# Patient Record
Sex: Male | Born: 1975 | Race: Black or African American | Hispanic: No | Marital: Single | Smoking: Never smoker
Health system: Southern US, Community
[De-identification: ages and names within clinical notes are randomized; demographics above are authoritative.]

## PROBLEM LIST (undated history)

## (undated) DIAGNOSIS — I10 Essential (primary) hypertension: Secondary | ICD-10-CM

## (undated) DIAGNOSIS — E119 Type 2 diabetes mellitus without complications: Secondary | ICD-10-CM

## (undated) HISTORY — DX: Type 2 diabetes mellitus without complications: E11.9

## (undated) HISTORY — PX: FINGER EXPLORATION: SHX1635

---

## 2009-06-27 ENCOUNTER — Emergency Department (HOSPITAL_COMMUNITY): Admission: EM | Admit: 2009-06-27 | Discharge: 2009-06-27 | Payer: Self-pay | Admitting: Family Medicine

## 2013-09-24 ENCOUNTER — Encounter (HOSPITAL_COMMUNITY): Payer: Self-pay | Admitting: Emergency Medicine

## 2013-09-24 DIAGNOSIS — I10 Essential (primary) hypertension: Secondary | ICD-10-CM | POA: Insufficient documentation

## 2013-09-24 DIAGNOSIS — K130 Diseases of lips: Secondary | ICD-10-CM | POA: Insufficient documentation

## 2013-09-24 NOTE — ED Notes (Signed)
Pt. reports progressing right lower lip abscess onset last week with drainage .

## 2013-09-25 ENCOUNTER — Emergency Department (HOSPITAL_COMMUNITY)
Admission: EM | Admit: 2013-09-25 | Discharge: 2013-09-25 | Disposition: A | Discharge status: Home / Self Care | Payer: Self-pay | Attending: Emergency Medicine | Admitting: Emergency Medicine

## 2013-09-25 DIAGNOSIS — K130 Diseases of lips: Secondary | ICD-10-CM

## 2013-09-25 HISTORY — DX: Essential (primary) hypertension: I10

## 2013-09-25 MED ORDER — SULFAMETHOXAZOLE-TRIMETHOPRIM 800-160 MG PO TABS: 1.0000 | ORAL_TABLET | Freq: Two times a day (BID) | ORAL | Status: DC

## 2013-09-25 MED ORDER — TRAMADOL HCL 50 MG PO TABS: 50.0000 mg | ORAL_TABLET | Freq: Four times a day (QID) | ORAL | Status: DC | PRN

## 2013-09-25 NOTE — ED Notes (Signed)
R lower lip abscess since last Thursday. Lip swollen and red with a visible 'head.' Pt states the pain to lip is making his blood pressures high.

## 2013-09-25 NOTE — Discharge Instructions (Signed)
Take the prescribed medication as directed. Continue warm compresses at home to help aid drainage. Return to the ED for increased redness, swelling, drainage, etc.

## 2013-09-25 NOTE — ED Provider Notes (Signed)
Medical screening examination/treatment/procedure(s) were performed by non-physician practitioner and as supervising physician I was immediately available for consultation/collaboration.   EKG Interpretation None        Gwyneth SproutWhitney Mikhaila Roh, MD 09/25/13 [phone]

## 2013-09-25 NOTE — ED Provider Notes (Signed)
CSN: [id number]     Arrival date & time 09/24/13  2242 History   First MD Initiated Contact with Patient 09/25/13 0033     Chief Complaint  Patient presents with  . Oral Swelling     (Consider location/radiation/quality/duration/timing/severity/associated sxs/prior Treatment) The history is provided by the patient and medical records.   This is a 38 year old male with past medical history significant for hypertension, presenting to the ED for right lower lip abscess. Patient states abscess started to approximately one week ago, has been progressively increasing in size. States yesterday it developed a "head", he popped it with some purulent drainage expelled. States since then it has scabbed over and stop draining. He now has increased swelling of his right lower lip. Denies any difficulty swallowing or breathing. Denies any fevers or chills. Denies any dental pain.  Past Medical History  Diagnosis Date  . Hypertension    History reviewed. No pertinent past surgical history. No family history on file. History  Substance Use Topics  . Smoking status: Never Smoker   . Smokeless tobacco: Not on file  . Alcohol Use: Yes    Review of Systems  Skin:       Abscess, lip swelling  All other systems reviewed and are negative.   Allergies  Review of patient's allergies indicates no known allergies.  Home Medications  No current outpatient prescriptions on file. BP 184/98  Pulse 98  Temp(Src) 99.3 F (37.4 C) (Oral)  Resp 14  Ht 6' (1.829 m)  Wt 239 lb (108.41 kg)  BMI 32.41 kg/m2  SpO2 100%  Physical Exam  Nursing note and vitals reviewed. Constitutional: He is oriented to person, place, and time. He appears well-developed and well-nourished. No distress.  HENT:  Head: Normocephalic and atraumatic.  Mouth/Throat: Uvula is midline, oropharynx is clear and moist and mucous membranes are normal. No oropharyngeal exudate, posterior oropharyngeal edema, posterior oropharyngeal  erythema or tonsillar abscesses.    Small non-draining abscess of right lower lip with scab present; large amount of swelling without extension into chin or neck; dentition intact without evidence of dental abscess; gingiva normal in appearance; airway patent; handling secretions appropriately; no difficulty swallowing or speaking  Eyes: Conjunctivae and EOM are normal. Pupils are equal, round, and reactive to light.  Neck: Normal range of motion. Neck supple.  Cardiovascular: Normal rate, regular rhythm and normal heart sounds.   Pulmonary/Chest: Effort normal and breath sounds normal. No respiratory distress. He has no wheezes.  Abdominal: Soft. Bowel sounds are normal. There is no tenderness. There is no guarding.  Musculoskeletal: Normal range of motion.  Neurological: He is alert and oriented to person, place, and time.  Skin: Skin is warm and dry. He is not diaphoretic.  Psychiatric: He has a normal mood and affect.    ED Course  Procedures (including critical care time)  INCISION AND DRAINAGE Performed by: Garlon HatchetSANDERS, Maylea Soria M Consent: Verbal consent obtained. Risks and benefits: risks, benefits and alternatives were discussed Type: abscess  Body area: right lower lip  Anesthesia: local infiltration  Incision was made with a scalpel.  Local anesthetic: lidocaine 1% without epinephrine  Anesthetic total: 4 ml  Complexity: complex Blunt dissection to break up loculations  Drainage: purulent  Drainage amount: small  Packing material: none  Patient tolerance: Patient tolerated the procedure well with no immediate complications.    Labs Review Labs Reviewed - No data to display Imaging Review No results found.   EKG Interpretation None  MDM   Final diagnoses:  Abscess, lip   I&D performed with small amount of purulent drainage expelled.  Given location and severity of swelling, will start on abx, advised to do warm compresses at home to help aid  drainage.  Pt will FU with wellness clinic.  Monitor for increased redness, swelling, fevers, chills-- if these occur, return to the ED for re-check.  Discussed plan with pt, he acknowledged understanding and agreed with plan of care.  Return precautions advised.  Garlon Hatchet, PA-C 09/25/13 [phone]

## 2014-11-10 ENCOUNTER — Emergency Department (HOSPITAL_COMMUNITY)
Admission: EM | Admit: 2014-11-10 | Discharge: 2014-11-10 | Disposition: A | Discharge status: Home / Self Care | Payer: Self-pay | Attending: Emergency Medicine | Admitting: Emergency Medicine

## 2014-11-10 ENCOUNTER — Emergency Department (HOSPITAL_COMMUNITY): Payer: Self-pay

## 2014-11-10 ENCOUNTER — Encounter (HOSPITAL_COMMUNITY): Payer: Self-pay | Admitting: *Deleted

## 2014-11-10 ENCOUNTER — Other Ambulatory Visit: Payer: Self-pay

## 2014-11-10 DIAGNOSIS — R0789 Other chest pain: Secondary | ICD-10-CM

## 2014-11-10 DIAGNOSIS — I10 Essential (primary) hypertension: Secondary | ICD-10-CM | POA: Insufficient documentation

## 2014-11-10 DIAGNOSIS — I159 Secondary hypertension, unspecified: Secondary | ICD-10-CM | POA: Insufficient documentation

## 2014-11-10 LAB — CBC
HCT: 42 % (ref 39.0–52.0)
Hemoglobin: 15.1 g/dL (ref 13.0–17.0)
MCH: 28.8 pg (ref 26.0–34.0)
MCHC: 36 g/dL (ref 30.0–36.0)
MCV: 80 fL (ref 78.0–100.0)
Platelets: 248 10*3/uL (ref 150–400)
RBC: 5.25 MIL/uL (ref 4.22–5.81)
RDW: 12.7 % (ref 11.5–15.5)
WBC: 4.6 10*3/uL (ref 4.0–10.5)

## 2014-11-10 LAB — BASIC METABOLIC PANEL
Anion gap: 10 (ref 5–15)
BUN: 6 mg/dL (ref 6–20)
CALCIUM: 8.9 mg/dL (ref 8.9–10.3)
CO2: 25 mmol/L (ref 22–32)
CREATININE: 1.06 mg/dL (ref 0.61–1.24)
Chloride: 102 mmol/L (ref 101–111)
GFR calc Af Amer: >60 mL/min (ref >60)
GLUCOSE: 347 mg/dL — AB (ref 70–99)
Potassium: 3.5 mmol/L (ref 3.5–5.1)
Sodium: 137 mmol/L (ref 135–145)

## 2014-11-10 LAB — I-STAT TROPONIN, ED: Troponin i, poc: 0.01 ng/mL (ref 0.00–0.08)

## 2014-11-10 MED ORDER — IBUPROFEN 600 MG PO TABS: 600.0000 mg | ORAL_TABLET | Freq: Four times a day (QID) | ORAL | Status: DC | PRN

## 2014-11-10 MED ORDER — IBUPROFEN 400 MG PO TABS
800.0000 mg | ORAL_TABLET | Freq: Once | ORAL | Status: AC
  Administered 2014-11-10: 800 mg via ORAL
  Filled 2014-11-10: qty 2

## 2014-11-10 MED ORDER — AMLODIPINE BESYLATE 10 MG PO TABS: 10.0000 mg | ORAL_TABLET | Freq: Every day | ORAL | Status: DC

## 2014-11-10 NOTE — ED Notes (Signed)
Pt sts he was having some tingling in his left arm which "felt like my arm was numb".  Sts it has been getting better through out the day.

## 2014-11-10 NOTE — ED Provider Notes (Signed)
CSN: [id number]     Arrival date & time 11/10/14  1550 History   First MD Initiated Contact with Patient 11/10/14 2014     Chief Complaint  Patient presents with  . Chest Pain     (Consider location/radiation/quality/duration/timing/severity/associated sxs/prior Treatment) HPI Comments: Patient is a 39 yo M PMHx significant for HTN presenting to the ED for evaluation of constant chest pressure with radiation to his left arm. He describes his left arm as "tingling." He states exercise makes his chest pain better. He states had this pain on and off for the last several weeks it became more constant over the last 3 days. He states since he has been in the emergency department his pain has gotten better, rates at a 1 out of 10 currently. Denies any early familial cardiac history. No history of echocardiogram, cardiac catheterization, stress test. PERC negative.   Patient is a 39 y.o. male presenting with chest pain.  Chest Pain   Past Medical History  Diagnosis Date  . Hypertension    History reviewed. No pertinent past surgical history. No family history on file. History  Substance Use Topics  . Smoking status: Never Smoker   . Smokeless tobacco: Not on file  . Alcohol Use: Yes    Review of Systems  Cardiovascular: Positive for chest pain.  All other systems reviewed and are negative.     Allergies  Review of patient's allergies indicates no known allergies.  Home Medications   Prior to Admission medications   Medication Sig Start Date End Date Taking? Authorizing Provider  amLODipine (NORVASC) 10 MG tablet Take 1 tablet (10 mg total) by mouth daily. 11/10/14   Majesta Leichter, PA-C  ibuprofen (ADVIL,MOTRIN) 600 MG tablet Take 1 tablet (600 mg total) by mouth every 6 (six) hours as needed. 11/10/14   Tyffani Foglesong, PA-C  sulfamethoxazole-trimethoprim (SEPTRA DS) 800-160 MG per tablet Take 1 tablet by mouth every 12 (twelve) hours. Patient not taking: Reported on  11/10/2014 09/25/13   Garlon HatchetLisa M Sanders, PA-C  traMADol (ULTRAM) 50 MG tablet Take 1 tablet (50 mg total) by mouth every 6 (six) hours as needed. Patient not taking: Reported on 11/10/2014 09/25/13   Garlon HatchetLisa M Sanders, PA-C   BP 139/101 mmHg  Pulse 78  Temp(Src) 98.6 F (37 C) (Oral)  Resp 18  SpO2 100% Physical Exam  Constitutional: He is oriented to person, place, and time. He appears well-developed and well-nourished. No distress.  HENT:  Head: Normocephalic and atraumatic.  Right Ear: External ear normal.  Left Ear: External ear normal.  Nose: Nose normal.  Mouth/Throat: No oropharyngeal exudate.  Eyes: Conjunctivae are normal.  Neck: Neck supple.  Cardiovascular: Normal rate, regular rhythm and normal heart sounds.   Pulmonary/Chest: Effort normal and breath sounds normal. He exhibits no tenderness.  Abdominal: Soft. There is no tenderness.  Musculoskeletal: Normal range of motion. He exhibits no edema.  Neurological: He is alert and oriented to person, place, and time.  Sensation grossly intact.   Skin: Skin is warm and dry. He is not diaphoretic.  Nursing note and vitals reviewed.   ED Course  Procedures (including critical care time) Medications  ibuprofen (ADVIL,MOTRIN) tablet 800 mg (800 mg Oral Given 11/10/14 2113)    Labs Review Labs Reviewed  BASIC METABOLIC PANEL - Abnormal; Notable for the following:    Glucose, Bld 347 (*)    All other components within normal limits  CBC  I-STAT TROPOININ, ED    Imaging Review Dg Chest  2 View  11/10/2014   CLINICAL DATA:  One day history of chest pain  EXAM: CHEST  2 VIEW  COMPARISON:  None.  FINDINGS: Lungs are clear. Heart size and pulmonary vascularity are normal. No adenopathy. No pneumothorax. No bone lesions.  IMPRESSION: No abnormality noted.   Electronically Signed   By: Bretta BangWilliam  Woodruff III M.D.   On: 11/10/2014 17:17     EKG Interpretation   Date/Time:  Wednesday Nov 10 2014 15:55:58 EDT Ventricular Rate:  99 PR  Interval:  166 QRS Duration: 86 QT Interval:  316 QTC Calculation: 405 R Axis:   63 Text Interpretation:  Sinus rhythm with marked sinus arrhythmia T wave  abnormality, consider inferolateral ischemia Abnormal ECG Sinus rhythm T  wave abnormality Abnormal ekg Confirmed by Gerhard MunchLOCKWOOD, ROBERT  MD [phone]([id number]) on  11/10/2014 8:58:53 PM      MDM   Final diagnoses:  Chest pain, atypical  Secondary hypertension, unspecified    Filed Vitals:   11/10/14 2116  BP: 139/101  Pulse: 78  Temp: 98.6 F (37 C)  Resp: 18   Afebrile, NAD, non-toxic appearing, AAOx4.   1) CP: Patient is to be discharged with recommendation to follow up with PCP in regards to today's hospital visit. Chest pain is not likely of cardiac or pulmonary etiology d/t presentation, perc negative, VSS, no tracheal deviation, no JVD or new murmur, RRR, breath sounds equal bilaterally, EKG reviewed, negative troponin, and negative CXR. Heart score 2.   2) HTN: Patient noted to be hypertensive in the emergency department.  No signs of hypertensive urgency.  Discussed with patient the need for close follow-up and management by their primary care physician.   Pt has been advised start a PPI and return to the ED is CP becomes exertional, associated with diaphoresis or nausea, radiates to left jaw/arm, worsens or becomes concerning in any way. Pt appears reliable for follow up and is agreeable to discharge. Patient d/w with Dr. Jeraldine LootsLockwood, agrees with plan.     Francee PiccoloJennifer Deziyah Arvin, PA-C 11/11/14 0043  Gerhard Munchobert Lockwood, MD 11/11/14 2056

## 2014-11-10 NOTE — ED Notes (Signed)
Pt states that he began having progressive left chest pain that radiates under arm. Pt states that pain was intermittent initially and is now constant. Denies SOB

## 2014-11-10 NOTE — Discharge Instructions (Signed)
Please follow up with your primary care physician in 1-2 days. If you do not have one please call the [facility] number listed above. Please read all discharge instructions and return precautions.    Chest Pain (Nonspecific) It is often hard to give a specific diagnosis for the cause of chest pain. There is always a chance that your pain could be related to something serious, such as a heart attack or a blood clot in the lungs. You need to follow up with your health care provider for further evaluation. CAUSES   Heartburn.  Pneumonia or bronchitis.  Anxiety or stress.  Inflammation around your heart (pericarditis) or lung (pleuritis or pleurisy).  A blood clot in the lung.  A collapsed lung (pneumothorax). It can develop suddenly on its own (spontaneous pneumothorax) or from trauma to the chest.  Shingles infection (herpes zoster virus). The chest wall is composed of bones, muscles, and cartilage. Any of these can be the source of the pain.  The bones can be bruised by injury.  The muscles or cartilage can be strained by coughing or overwork.  The cartilage can be affected by inflammation and become sore (costochondritis). DIAGNOSIS  Lab tests or other studies may be needed to find the cause of your pain. Your health care provider may have you take a test called an ambulatory electrocardiogram (ECG). An ECG records your heartbeat patterns over a 24-hour period. You may also have other tests, such as:  Transthoracic echocardiogram (TTE). During echocardiography, sound waves are used to evaluate how blood flows through your heart.  Transesophageal echocardiogram (TEE).  Cardiac monitoring. This allows your health care provider to monitor your heart rate and rhythm in real time.  Holter monitor. This is a portable device that records your heartbeat and can help diagnose heart arrhythmias. It allows your health care provider to track your heart activity for  several days, if needed.  Stress tests by exercise or by giving medicine that makes the heart beat faster. TREATMENT   Treatment depends on what may be causing your chest pain. Treatment may include:  Acid blockers for heartburn.  Anti-inflammatory medicine.  Pain medicine for inflammatory conditions.  Antibiotics if an infection is present.  You may be advised to change lifestyle habits. This includes stopping smoking and avoiding alcohol, caffeine, and chocolate.  You may be advised to keep your head raised (elevated) when sleeping. This reduces the chance of acid going backward from your stomach into your esophagus. Most of the time, nonspecific chest pain will improve within 2-3 days with rest and mild pain medicine.  HOME CARE INSTRUCTIONS   If antibiotics were prescribed, take them as directed. Finish them even if you start to feel better.  For the next few days, avoid physical activities that bring on chest pain. Continue physical activities as directed.  Do not use any tobacco products, including cigarettes, chewing tobacco, or electronic cigarettes.  Avoid drinking alcohol.  Only take medicine as directed by your health care provider.  Follow your health care provider's suggestions for further testing if your chest pain does not go away.  Keep any follow-up appointments you made. If you do not go to an appointment, you could develop lasting (chronic) problems with pain. If there is any problem keeping an appointment, call to reschedule. SEEK MEDICAL CARE IF:   Your chest pain does not go away, even after treatment.  You have a rash with blisters on your chest.  You have a fever.  SEEK IMMEDIATE MEDICAL CARE IF:   You have increased chest pain or pain that spreads to your arm, neck, jaw, back, or abdomen.  You have shortness of breath.  You have an increasing cough, or you cough up blood.  You have severe back or abdominal pain.  You feel nauseous or  vomit.  You have severe weakness.  You faint.  You have chills. This is an emergency. Do not wait to see if the pain will go away. Get medical help at once. Call your local emergency services (911 in [place] Do not drive yourself to the hospital. MAKE SURE YOU:   Understand these instructions.  Will watch your condition.  Will get help right away if you are not doing well or get worse. Document Released: 03/28/2005 Document Revised: 06/23/2013 Document Reviewed: 01/22/2008 [facility] Patient Information 2015 [place], [place]. This information is not intended to replace advice given to you by your health care provider. Make sure you discuss any questions you have with your health care provider. Hypertension Hypertension, commonly called high blood pressure, is when the force of blood pumping through your arteries is too strong. Your arteries are the blood vessels that carry blood from your heart throughout your body. A blood pressure reading consists of a higher number over a lower number, such as 110/72. The higher number (systolic) is the pressure inside your arteries when your heart pumps. The lower number (diastolic) is the pressure inside your arteries when your heart relaxes. Ideally you want your blood pressure below 120/80. Hypertension forces your heart to work harder to pump blood. Your arteries may become narrow or stiff. Having hypertension puts you at risk for heart disease, stroke, and other problems.  RISK FACTORS Some risk factors for high blood pressure are controllable. Others are not.  Risk factors you cannot control include:   Race. You may be at higher risk if you are African American.  Age. Risk increases with age.  Gender. Men are at higher risk than women before age 40 years. After age 40, women are at higher risk than men. Risk factors you can control include:  Not getting enough exercise or physical activity.  Being overweight.  Getting too much fat, sugar,  calories, or salt in your diet.  Drinking too much alcohol. SIGNS AND SYMPTOMS Hypertension does not usually cause signs or symptoms. Extremely high blood pressure (hypertensive crisis) may cause headache, anxiety, shortness of breath, and nosebleed. DIAGNOSIS  To check if you have hypertension, your health care provider will measure your blood pressure while you are seated, with your arm held at the level of your heart. It should be measured at least twice using the same arm. Certain conditions can cause a difference in blood pressure between your right and left arms. A blood pressure reading that is higher than normal on one occasion does not mean that you need treatment. If one blood pressure reading is high, ask your health care provider about having it checked again. TREATMENT  Treating high blood pressure includes making lifestyle changes and possibly taking medicine. Living a healthy lifestyle can help lower high blood pressure. You may need to change some of your habits. Lifestyle changes may include:  Following the DASH diet. This diet is high in fruits, vegetables, and whole grains. It is low in salt, red meat, and added sugars.  Getting at least 2 hours of brisk physical activity every week.  Losing weight if necessary.  Not smoking.  Limiting alcoholic beverages.  Learning ways  to reduce stress. If lifestyle changes are not enough to get your blood pressure under control, your health care provider may prescribe medicine. You may need to take more than one. Work closely with your health care provider to understand the risks and benefits. HOME CARE INSTRUCTIONS  Have your blood pressure rechecked as directed by your health care provider.   Take medicines only as directed by your health care provider. Follow the directions carefully. Blood pressure medicines must be taken as prescribed. The medicine does not work as well when you skip doses. Skipping doses also puts you at risk  for problems.   Do not smoke.   Monitor your blood pressure at home as directed by your health care provider. SEEK MEDICAL CARE IF:   You think you are having a reaction to medicines taken.  You have recurrent headaches or feel dizzy.  You have swelling in your ankles.  You have trouble with your vision. SEEK IMMEDIATE MEDICAL CARE IF:  You develop a severe headache or confusion.  You have unusual weakness, numbness, or feel faint.  You have severe chest or abdominal pain.  You vomit repeatedly.  You have trouble breathing. MAKE SURE YOU:   Understand these instructions.  Will watch your condition.  Will get help right away if you are not doing well or get worse. Document Released: 06/18/2005 Document Revised: 11/02/2013 Document Reviewed: 04/10/2013 [facility]Exit[facility] Patient Information 2015 [place], [place]. This information is not intended to replace advice given to you by your health care provider. Make sure you discuss any questions you have with your health care provider.

## 2014-12-16 ENCOUNTER — Emergency Department (HOSPITAL_COMMUNITY)
Admission: EM | Admit: 2014-12-16 | Discharge: 2014-12-16 | Disposition: A | Discharge status: Home / Self Care | Payer: Self-pay | Attending: Emergency Medicine | Admitting: Emergency Medicine

## 2014-12-16 ENCOUNTER — Encounter (HOSPITAL_COMMUNITY): Payer: Self-pay | Admitting: Emergency Medicine

## 2014-12-16 DIAGNOSIS — M79631 Pain in right forearm: Secondary | ICD-10-CM | POA: Insufficient documentation

## 2014-12-16 DIAGNOSIS — M79601 Pain in right arm: Secondary | ICD-10-CM

## 2014-12-16 DIAGNOSIS — I10 Essential (primary) hypertension: Secondary | ICD-10-CM | POA: Insufficient documentation

## 2014-12-16 DIAGNOSIS — Z79899 Other long term (current) drug therapy: Secondary | ICD-10-CM | POA: Insufficient documentation

## 2014-12-16 MED ORDER — TRAMADOL HCL 50 MG PO TABS: 50.0000 mg | ORAL_TABLET | Freq: Four times a day (QID) | ORAL | Status: DC | PRN

## 2014-12-16 MED ORDER — METHOCARBAMOL 500 MG PO TABS: 500.0000 mg | ORAL_TABLET | Freq: Two times a day (BID) | ORAL | Status: DC

## 2014-12-16 MED ORDER — DIAZEPAM 5 MG PO TABS
5.0000 mg | ORAL_TABLET | Freq: Once | ORAL | Status: AC
  Administered 2014-12-16: 5 mg via ORAL
  Filled 2014-12-16: qty 1

## 2014-12-16 MED ORDER — OXYCODONE-ACETAMINOPHEN 5-325 MG PO TABS
1.0000 | ORAL_TABLET | Freq: Once | ORAL | Status: AC
  Administered 2014-12-16: 1 via ORAL
  Filled 2014-12-16: qty 1

## 2014-12-16 NOTE — ED Notes (Signed)
PA at bedside.

## 2014-12-16 NOTE — ED Notes (Signed)
NAD at this time.  

## 2014-12-16 NOTE — ED Notes (Signed)
Pt. reports tingling at right forearm this morning , unsure if he injured it last night while wrestling with wife or due to heavy lifting at work this week . Equal strong grips , pain increases at upper arm with movement .

## 2014-12-16 NOTE — Discharge Instructions (Signed)
Take the prescribed medication as directed.  Rest arm today. [facility] if continue having symptoms. Return to the ED for new or worsening symptoms.

## 2014-12-16 NOTE — ED Provider Notes (Signed)
CSN: [id number]     Arrival date & time 12/16/14  0932 History   First MD Initiated Contact with Patient 12/16/14 [phone]     Chief Complaint  Patient presents with  . Arm Injury     (Consider location/radiation/quality/duration/timing/severity/associated sxs/prior Treatment) Patient is a 39 y.o. male presenting with arm injury. The history is provided by the patient and medical records.  Arm Injury  This is a 39 year old male with past medical history significant for hypertension, presenting to the ED for paresthesias of his right forearm extending down to right hand. States he and significant other were "fooling around" last night and fell asleep on the couch afterwards.  He states he did not sleep on his arm.  He denies numbness or weakness of right arm.  He denies any known injuries.  He is right hand dominant.  Patient does admit to shoulder spasms over the past few days.  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  He states last week he worked 80+ hours last week and has already worked 47 hours this week.  He denies any dizziness, lightheadedness, confusion, changes in speech, weakness, gait disturbance.  Past Medical History  Diagnosis Date  . Hypertension    History reviewed. No pertinent past surgical history. No family history on file. History  Substance Use Topics  . Smoking status: Never Smoker   . Smokeless tobacco: Not on file  . Alcohol Use: Yes    Review of Systems  Musculoskeletal:       Paresthesias right arm  All other systems reviewed and are negative.     Allergies  Review of patient's allergies indicates no known allergies.  Home Medications   Prior to Admission medications   Medication Sig Start Date End Date Taking? Authorizing Provider  amLODipine (NORVASC) 10 MG tablet Take 1 tablet (10 mg total) by mouth daily. 11/10/14   Jennifer Piepenbrink, PA-C  ibuprofen (ADVIL,MOTRIN) 600 MG tablet  Take 1 tablet (600 mg total) by mouth every 6 (six) hours as needed. 11/10/14   Jennifer Piepenbrink, PA-C  sulfamethoxazole-trimethoprim (SEPTRA DS) 800-160 MG per tablet Take 1 tablet by mouth every 12 (twelve) hours. Patient not taking: Reported on 11/10/2014 09/25/13   Garlon Hatchet, PA-C  traMADol (ULTRAM) 50 MG tablet Take 1 tablet (50 mg total) by mouth every 6 (six) hours as needed. Patient not taking: Reported on 11/10/2014 09/25/13   Garlon Hatchet, PA-C   BP 105/74 mmHg  Pulse 96  Temp(Src) 98.2 F (36.8 C) (Oral)  Resp 14  Ht 6' (1.829 m)  Wt 192 lb (87.091 kg)  BMI 26.03 kg/m2  SpO2 98%   Physical Exam  Constitutional: He is oriented to person, place, and time. He appears well-developed and well-nourished. No distress.  HENT:  Head: Normocephalic and atraumatic.  Mouth/Throat: Oropharynx is clear and moist.  Eyes: Conjunctivae and EOM are normal. Pupils are equal, round, and reactive to light.  Neck: Normal range of motion. Neck supple.  Cardiovascular: Normal rate, regular rhythm and normal heart sounds.   Pulmonary/Chest: Effort normal and breath sounds normal. No respiratory distress. He has no wheezes.  Abdominal: Soft. Bowel sounds are normal. There is no tenderness. There is no guarding.  Musculoskeletal: Normal range of motion. He exhibits no edema.  Right arm normal in appearance; no bony deformities or swelling; no overlying skin changes or warmth to touch; no palpable cords or varicosities; full ROM of right shoulder, elbow,  wrist, hand, and all fingers; normal grip strength; strong radial pulse and cap refill; normal sensation to light touch throughout entire right arm  Neurological: He is alert and oriented to person, place, and time.  AAOx3, answering questions appropriately; equal strength UE and LE bilaterally; CN grossly intact; moves all extremities appropriately without ataxia; no focal neuro deficits or facial asymmetry appreciated  Skin: Skin is warm and  dry. He is not diaphoretic.  Psychiatric: He has a normal mood and affect.  Nursing note and vitals reviewed.   ED Course  Procedures (including critical care time) Labs Review Labs Reviewed - No data to display  Imaging Review No results found.   EKG Interpretation None      MDM   Final diagnoses:  Right arm pain   39 year old male here with paresthesias of right forearm down to hand which began this morning. Denies known injury, trauma, or falls. He denies any arm numbness or weakness. He states he does have some pain with moving his right shoulder. 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 He also admits he worked 80+ hours last week and 30 worked 47 hours this week. On exam, there are no acute deformities about the right arm. No appreciable swelling or signs of DVT. He has normal range of motion of his right shoulder, elbow, wrist, hand, and all fingers. His arm is neurovascularly intact with normal strength. He has no focal neurologic deficits and I doubt central cause of his paresthesias such as TIA or stroke.  He has been having muscle spasms of right shoulder recently-- i suspect this is causing the paresthesias.  Patient was treated with percocet and valium here in the ED with resolution of symptoms.  Patient will be d/c home with pain meds, muscle relaxer.  Encouraged to rest arm today.  Given FU with [facility].  Discussed plan with patient, he/she acknowledged understanding and agreed with plan of care.  Return precautions given for new or worsening symptoms.  Garlon Hatchet, PA-C 12/16/14 1101  Kristen N Ward, DO 12/24/14 [phone]

## 2015-01-08 ENCOUNTER — Encounter (HOSPITAL_COMMUNITY): Payer: Self-pay | Admitting: *Deleted

## 2015-01-08 ENCOUNTER — Emergency Department (HOSPITAL_COMMUNITY): Payer: Self-pay

## 2015-01-08 ENCOUNTER — Emergency Department (HOSPITAL_COMMUNITY)
Admission: EM | Admit: 2015-01-08 | Discharge: 2015-01-08 | Disposition: A | Discharge status: Home / Self Care | Payer: Self-pay | Attending: Emergency Medicine | Admitting: Emergency Medicine

## 2015-01-08 DIAGNOSIS — R5383 Other fatigue: Secondary | ICD-10-CM | POA: Insufficient documentation

## 2015-01-08 DIAGNOSIS — R0602 Shortness of breath: Secondary | ICD-10-CM

## 2015-01-08 DIAGNOSIS — Z79899 Other long term (current) drug therapy: Secondary | ICD-10-CM | POA: Insufficient documentation

## 2015-01-08 DIAGNOSIS — R110 Nausea: Secondary | ICD-10-CM | POA: Insufficient documentation

## 2015-01-08 DIAGNOSIS — I10 Essential (primary) hypertension: Secondary | ICD-10-CM | POA: Insufficient documentation

## 2015-01-08 DIAGNOSIS — J209 Acute bronchitis, unspecified: Secondary | ICD-10-CM | POA: Insufficient documentation

## 2015-01-08 LAB — CBC WITH DIFFERENTIAL/PLATELET
Basophils Absolute: 0 10*3/uL (ref 0.0–0.1)
Basophils Relative: 0 % (ref 0–1)
Eosinophils Absolute: 0.1 10*3/uL (ref 0.0–0.7)
Eosinophils Relative: 1 % (ref 0–5)
HCT: 42.7 % (ref 39.0–52.0)
Hemoglobin: 15.6 g/dL (ref 13.0–17.0)
Lymphocytes Relative: 27 % (ref 12–46)
Lymphs Abs: 1.2 10*3/uL (ref 0.7–4.0)
MCH: 29.3 pg (ref 26.0–34.0)
MCHC: 36.5 g/dL — ABNORMAL HIGH (ref 30.0–36.0)
MCV: 80.1 fL (ref 78.0–100.0)
Monocytes Absolute: 0.5 10*3/uL (ref 0.1–1.0)
Monocytes Relative: 10 % (ref 3–12)
Neutro Abs: 2.8 10*3/uL (ref 1.7–7.7)
Neutrophils Relative %: 62 % (ref 43–77)
Platelets: 225 10*3/uL (ref 150–400)
RBC: 5.33 MIL/uL (ref 4.22–5.81)
RDW: 12.2 % (ref 11.5–15.5)
WBC: 4.5 10*3/uL (ref 4.0–10.5)

## 2015-01-08 LAB — BASIC METABOLIC PANEL
Anion gap: 9 (ref 5–15)
BUN: 5 mg/dL — ABNORMAL LOW (ref 6–20)
CALCIUM: 9 mg/dL (ref 8.9–10.3)
CO2: 27 mmol/L (ref 22–32)
CREATININE: 1.13 mg/dL (ref 0.61–1.24)
Chloride: 98 mmol/L — ABNORMAL LOW (ref 101–111)
GFR calc Af Amer: >60 mL/min (ref >60)
GFR calc non Af Amer: >60 mL/min (ref >60)
GLUCOSE: 369 mg/dL — AB (ref 65–99)
Potassium: 3.7 mmol/L (ref 3.5–5.1)
Sodium: 134 mmol/L — ABNORMAL LOW (ref 135–145)

## 2015-01-08 LAB — TROPONIN I

## 2015-01-08 MED ORDER — AZITHROMYCIN 250 MG PO TABS: 250.0000 mg | ORAL_TABLET | Freq: Every day | ORAL | Status: DC

## 2015-01-08 MED ORDER — SODIUM CHLORIDE 0.9 % IV BOLUS (SEPSIS)
1000.0000 mL | Freq: Once | INTRAVENOUS | Status: AC
  Administered 2015-01-08: 1000 mL via INTRAVENOUS

## 2015-01-08 MED ORDER — ALBUTEROL SULFATE HFA 108 (90 BASE) MCG/ACT IN AERS
2.0000 | INHALATION_SPRAY | RESPIRATORY_TRACT | Status: DC | PRN
  Administered 2015-01-08: 2 via RESPIRATORY_TRACT
  Filled 2015-01-08: qty 6.7

## 2015-01-08 MED ORDER — INSULIN ASPART 100 UNIT/ML ~~LOC~~ SOLN
8.0000 [IU] | Freq: Once | SUBCUTANEOUS | Status: AC
  Administered 2015-01-08: 8 [IU] via SUBCUTANEOUS
  Filled 2015-01-08: qty 1

## 2015-01-08 NOTE — ED Provider Notes (Signed)
CSN: [id number]     Arrival date & time 01/08/15  1721 History   First MD Initiated Contact with Patient 01/08/15 1739     Chief Complaint  Patient presents with  . Shortness of Breath     (Consider location/radiation/quality/duration/timing/severity/associated sxs/prior Treatment) HPI Comments: 2 the ER for evaluation of shortness of breath and weakness. Patient reports that he has been sick for approximately 1 week. He has been experiencing chest congestion and cough. He has had some headache associated with the symptoms. There has been nausea but no vomiting or diarrhea. He has not noticed any fever. Patient reports that he started having increased difficulty breathing yesterday and it was worse this morning. No chest pain associated. Patient does work in a very hot environment, feels like he may be dehydrated.  Patient is a 39 y.o. male presenting with shortness of breath.  Shortness of Breath Associated symptoms: cough     Past Medical History  Diagnosis Date  . Hypertension    History reviewed. No pertinent past surgical history. No family history on file. History  Substance Use Topics  . Smoking status: Never Smoker   . Smokeless tobacco: Not on file  . Alcohol Use: Yes    Review of Systems  Constitutional: Positive for fatigue.  Respiratory: Positive for cough and shortness of breath.   Gastrointestinal: Positive for nausea.  All other systems reviewed and are negative.     Allergies  Review of patient's allergies indicates no known allergies.  Home Medications   Prior to Admission medications   Medication Sig Start Date End Date Taking? Authorizing Provider  amLODipine (NORVASC) 10 MG tablet Take 1 tablet (10 mg total) by mouth daily. 11/10/14   Jennifer Piepenbrink, PA-C  ibuprofen (ADVIL,MOTRIN) 600 MG tablet Take 1 tablet (600 mg total) by mouth every 6 (six) hours as needed. 11/10/14   Jennifer Piepenbrink, PA-C  methocarbamol (ROBAXIN) 500 MG tablet Take 1  tablet (500 mg total) by mouth 2 (two) times daily. 12/16/14   Garlon HatchetLisa M Sanders, PA-C  sulfamethoxazole-trimethoprim (SEPTRA DS) 800-160 MG per tablet Take 1 tablet by mouth every 12 (twelve) hours. Patient not taking: Reported on 11/10/2014 09/25/13   Garlon HatchetLisa M Sanders, PA-C  traMADol (ULTRAM) 50 MG tablet Take 1 tablet (50 mg total) by mouth every 6 (six) hours as needed. 12/16/14   Garlon HatchetLisa M Sanders, PA-C   BP 135/87 mmHg  Pulse 107  Temp(Src) 98.5 F (36.9 C) (Oral)  Resp 22  Ht 6' (1.829 m)  Wt 189 lb (85.73 kg)  BMI 25.63 kg/m2  SpO2 97% Physical Exam  Constitutional: He is oriented to person, place, and time. He appears well-developed and well-nourished. No distress.  HENT:  Head: Normocephalic and atraumatic.  Right Ear: Hearing normal.  Left Ear: Hearing normal.  Nose: Nose normal.  Mouth/Throat: Oropharynx is clear and moist and mucous membranes are normal.  Eyes: Conjunctivae and EOM are normal. Pupils are equal, round, and reactive to light.  Neck: Normal range of motion. Neck supple.  Cardiovascular: Regular rhythm, S1 normal and S2 normal.  Exam reveals no gallop and no friction rub.   No murmur heard. Pulmonary/Chest: Effort normal. No respiratory distress. He has decreased breath sounds. He exhibits no tenderness.  Abdominal: Soft. Normal appearance and bowel sounds are normal. There is no hepatosplenomegaly. There is no tenderness. There is no rebound, no guarding, no tenderness at McBurney's point and negative Murphy's sign. No hernia.  Musculoskeletal: Normal range of motion.  Neurological: He is  alert and oriented to person, place, and time. He has normal strength. No cranial nerve deficit or sensory deficit. Coordination normal. GCS eye subscore is 4. GCS verbal subscore is 5. GCS motor subscore is 6.  Skin: Skin is warm, dry and intact. No rash noted. No cyanosis.  Psychiatric: He has a normal mood and affect. His speech is normal and behavior is normal. Thought content  normal.  Nursing note and vitals reviewed.   ED Course  Procedures (including critical care time) Labs Review Labs Reviewed - No data to display  Imaging Review No results found.   EKG Interpretation   Date/Time:  Saturday January 08 2015 17:24:22 EDT Ventricular Rate:  103 PR Interval:  156 QRS Duration: 82 QT Interval:  320 QTC Calculation: 419 R Axis:   74 Text Interpretation:  Sinus tachycardia T wave abnormality, consider  inferolateral ischemia Abnormal ECG No significant change since last  tracing Confirmed by Quayshawn Nin  MD, Larri Yehle [phone]) on 01/08/2015 5:40:06  PM      MDM   Final diagnoses:  None   acute bronchitis  Presents to the emergency department for evaluation of cough and congestion. Patient reports being sick for 1 week. He has had progressively worsening shortness of breath over the last 24-48 hours. No history of asthma or COPD. Patient did have decreased breath sounds bilaterally with slight wheezing consistent with bronchospasm. This combined with his cough and congestion symptoms is consistent with acute bronchitis. Chest x-ray did not show any evidence of pneumonia. EKG unchanged from previous, troponin negative. Patient to be treated with bronchodilation therapy, Zithromax.  Patient did report that his work environment is very hot. He was slightly tachycardic upon arrival. This was likely combined effect of his difficulty breathing and mild dehydration. Patient administered IV fluid bolus here in the ER.    Gilda Crease, MD 01/08/15 [phone]

## 2015-01-08 NOTE — ED Notes (Signed)
Patient transported to X-ray 

## 2015-01-08 NOTE — ED Notes (Signed)
The pt is c/o having a cold for one week with sob headache cough sl nausea for one week

## 2015-01-08 NOTE — Discharge Instructions (Signed)

## 2015-01-09 LAB — CBG MONITORING, ED: GLUCOSE-CAPILLARY: 268 mg/dL — AB (ref 65–99)

## 2015-04-04 ENCOUNTER — Encounter (HOSPITAL_COMMUNITY): Payer: Self-pay | Admitting: Emergency Medicine

## 2015-04-04 ENCOUNTER — Emergency Department (HOSPITAL_COMMUNITY)
Admission: EM | Admit: 2015-04-04 | Discharge: 2015-04-04 | Disposition: A | Discharge status: Home / Self Care | Payer: MEDICAID | Attending: Emergency Medicine | Admitting: Emergency Medicine

## 2015-04-04 DIAGNOSIS — Y9389 Activity, other specified: Secondary | ICD-10-CM | POA: Insufficient documentation

## 2015-04-04 DIAGNOSIS — Y998 Other external cause status: Secondary | ICD-10-CM | POA: Insufficient documentation

## 2015-04-04 DIAGNOSIS — Y9289 Other specified places as the place of occurrence of the external cause: Secondary | ICD-10-CM | POA: Insufficient documentation

## 2015-04-04 DIAGNOSIS — S299XXA Unspecified injury of thorax, initial encounter: Secondary | ICD-10-CM | POA: Insufficient documentation

## 2015-04-04 DIAGNOSIS — I10 Essential (primary) hypertension: Secondary | ICD-10-CM | POA: Insufficient documentation

## 2015-04-04 DIAGNOSIS — W228XXA Striking against or struck by other objects, initial encounter: Secondary | ICD-10-CM | POA: Insufficient documentation

## 2015-04-04 MED ORDER — TRAMADOL HCL 50 MG PO TABS: 50.0000 mg | ORAL_TABLET | Freq: Four times a day (QID) | ORAL | Status: DC | PRN
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
## 2015-04-04 NOTE — ED Notes (Signed)
Incentive spirometry instruction given-- pt able to complete up to 1250.

## 2015-04-04 NOTE — ED Notes (Signed)
Scar on left chest not hot to touch. Scar scabbed over. Pink in color.
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
## 2015-04-04 NOTE — Discharge Instructions (Signed)
Blunt Chest Trauma °Blunt chest trauma is an injury caused by a blow to the chest. These chest injuries can be very painful. Blunt chest trauma often results in bruised or broken (fractured) ribs. Most cases of bruised and fractured ribs from blunt chest traumas get better after 1 to 3 weeks of rest and pain medicine. Often, the soft tissue in the chest wall is also injured, causing pain and bruising. Internal organs, such as the heart and lungs, may also be injured. Blunt chest trauma can lead to serious medical problems. This injury requires immediate medical care. °CAUSES  °· Motor vehicle collisions. °· Falls. °· Physical violence. °· Sports injuries. °SYMPTOMS  °· Chest pain. The pain may be worse when you move or breathe deeply. °· Shortness of breath. °· Lightheadedness. °· Bruising. °· Tenderness. °· Swelling. °DIAGNOSIS  °Your caregiver will do a physical exam. X-rays may be taken to look for fractures. However, minor rib fractures may not show up on X-rays until a few days after the injury. If a more serious injury is suspected, further imaging tests may be done. This may include ultrasounds, computed tomography (CT) scans, or magnetic resonance imaging (MRI). °TREATMENT  °Treatment depends on the severity of your injury. Your caregiver may prescribe pain medicines and deep breathing exercises. °HOME CARE INSTRUCTIONS °· Limit your activities until you can move around without much pain. °· Do not do any strenuous work until your injury is healed. °· Put ice on the injured area. °¨ Put ice in a plastic bag. °¨ Place a towel between your skin and the bag. °¨ Leave the ice on for 15-20 minutes, 03-04 times a day. °· You may wear a rib belt as directed by your caregiver to reduce pain. °· Practice deep breathing as directed by your caregiver to keep your lungs clear. °· Only take over-the-counter or prescription medicines for pain, fever, or discomfort as directed by your caregiver. °SEEK IMMEDIATE MEDICAL  CARE IF:  °· You have increasing pain or shortness of breath. °· You cough up blood. °· You have nausea, vomiting, or abdominal pain. °· You have a fever. °· You feel dizzy, weak, or you faint. °MAKE SURE YOU: °· Understand these instructions. °· Will watch your condition. °· Will get help right away if you are not doing well or get worse. °Document Released: 07/26/2004 Document Revised: 09/10/2011 Document Reviewed: 04/04/2011 °[employer]® Patient Information ©2015 [employer]. This information is not intended to replace advice given to you by your health care provider. Make sure you discuss any questions you have with your health care provider. ° ° °

## 2015-06-11 ENCOUNTER — Emergency Department (HOSPITAL_COMMUNITY)
Admission: EM | Admit: 2015-06-11 | Discharge: 2015-06-11 | Disposition: A | Discharge status: Home / Self Care | Payer: Self-pay | Attending: Emergency Medicine | Admitting: Emergency Medicine

## 2015-06-11 ENCOUNTER — Encounter (HOSPITAL_COMMUNITY): Payer: Self-pay | Admitting: Emergency Medicine

## 2015-06-11 DIAGNOSIS — J069 Acute upper respiratory infection, unspecified: Secondary | ICD-10-CM | POA: Insufficient documentation

## 2015-06-11 DIAGNOSIS — I10 Essential (primary) hypertension: Secondary | ICD-10-CM | POA: Insufficient documentation

## 2015-06-11 DIAGNOSIS — J309 Allergic rhinitis, unspecified: Secondary | ICD-10-CM | POA: Insufficient documentation

## 2015-06-11 MED ORDER — BENZONATATE 100 MG PO CAPS: 100.0000 mg | ORAL_CAPSULE | Freq: Three times a day (TID) | ORAL | Status: DC

## 2015-06-11 MED ORDER — FLUTICASONE PROPIONATE 50 MCG/ACT NA SUSP: 2.0000 | Freq: Every day | NASAL | Status: DC

## 2015-06-11 MED ORDER — CETIRIZINE HCL 10 MG PO TABS: 10.0000 mg | ORAL_TABLET | Freq: Every day | ORAL | Status: DC

## 2015-06-11 MED ORDER — NAPROXEN 250 MG PO TABS: 250.0000 mg | ORAL_TABLET | Freq: Two times a day (BID) | ORAL | Status: DC

## 2015-06-11 NOTE — ED Provider Notes (Signed)
CSN: [id number]     Arrival date & time 06/11/15  1623 History  By signing my name below, I, Placido Sou, attest that this documentation has been prepared under the direction and in the presence of Everlene Farrier, PA-C. Electronically Signed: Placido Sou, ED Scribe. 06/11/2015. 4:50 PM.     Chief Complaint  Patient presents with  . Nasal Congestion   The history is provided by the patient. No language interpreter was used.    HPI Comments: Donald Carter is a 39 y.o. male who presents to the Emergency Department complaining worsening nasal congestion with onset 1 week ago. Pt notes associated rhinorrhea, productive cough with yellow sputum, sore throat, chest tightness when coughing and post nasal drip further noting an episode of n/v 3 days ago which has since alleviated. He notes having been taking Theraflu as needed which has provided little relief and denies having taken any medications today. Pt denies any known drug allergies. Pt notes a hx of bronchitis but denies any hx of smoking. Pt notes a hx of HTN and has been prescribed medication for his symptoms in the past which he denies ever having taken. Pt denies any fevers, chills, abd pain, n/v/d, SOB, HA, neck stiffness, rashes, numbness, tingling, weakness and lightheadedness.   Past Medical History  Diagnosis Date  . Hypertension    History reviewed. No pertinent past surgical history. History reviewed. No pertinent family history. Social History  Substance Use Topics  . Smoking status: Never Smoker   . Smokeless tobacco: None  . Alcohol Use: Yes     Comment: ocasional    Review of Systems  Constitutional: Negative for fever and chills.  HENT: Positive for congestion, postnasal drip, rhinorrhea, sinus pressure and sore throat. Negative for drooling, ear discharge, ear pain and trouble swallowing.   Eyes: Negative for discharge, itching and visual disturbance.  Respiratory: Positive for cough and chest tightness.  Negative for shortness of breath and wheezing.   Cardiovascular: Negative for chest pain.  Gastrointestinal: Negative for nausea, vomiting, abdominal pain and diarrhea.  Musculoskeletal: Positive for myalgias. Negative for neck pain and neck stiffness.  Skin: Negative for rash.  Neurological: Negative for dizziness, syncope, weakness, light-headedness, numbness and headaches.   Allergies  Review of patient's allergies indicates no known allergies.  Home Medications   Prior to Admission medications   Medication Sig Start Date End Date Taking? Authorizing Provider  amLODipine (NORVASC) 10 MG tablet Take 1 tablet (10 mg total) by mouth daily. Patient not taking: Reported on 04/04/2015 11/10/14   Francee Piccolo, PA-C  benzonatate (TESSALON) 100 MG capsule Take 1 capsule (100 mg total) by mouth every 8 (eight) hours. 06/11/15   Everlene Farrier, PA-C  cetirizine (ZYRTEC ALLERGY) 10 MG tablet Take 1 tablet (10 mg total) by mouth daily. 06/11/15   Everlene Farrier, PA-C  fluticasone (FLONASE) 50 MCG/ACT nasal spray Place 2 sprays into both nostrils daily. 06/11/15   Everlene Farrier, PA-C  naproxen (NAPROSYN) 250 MG tablet Take 1 tablet (250 mg total) by mouth 2 (two) times daily with a meal. 06/11/15   Everlene Farrier, PA-C   BP 165/120 mmHg  Pulse 92  Temp(Src) 98.2 F (36.8 C) (Oral)  Resp 18  SpO2 100% Physical Exam Physical Exam  Constitutional: Pt appears well-developed and well-nourished. No distress.  Awake, alert, nontoxic appearance  HENT:   Head: Normocephalic and atraumatic. Boggy nasal turbinates bilaterally.  Mouth/Throat: Oropharynx is clear and moist. No oropharyngeal exudate. No tonsillar hypertrophy. Noexudates. No peritonsillar abscess.  No trismus; uvula midline without edema.  Ears: Bilateral tympanic membranes are pearly-gray without erythema or loss of landmarks.  Eyes: Conjunctivae are normal. No scleral icterus.  Neck: Normal range of motion. Neck supple.   Cardiovascular: Normal rate, regular rhythm and intact distal pulses.   Pulmonary/Chest: Effort normal and breath sounds normal. No respiratory distress. Pt has no wheezes.  Equal chest expansion. No rhonchi.  Abdominal: Soft.  Pt exhibits no mass. There is no tenderness. There is no rebound and no guarding.  Musculoskeletal: Normal range of motion. Pt exhibits no edema.  Neurological: Pt is alert.  Speech is clear and goal oriented Moves extremities without ataxia  Skin: Skin is warm and dry. Pt is not diaphoretic.  Psychiatric: Pt has a normal mood and affect.  Nursing note and vitals reviewed.  ED Course  Procedures  DIAGNOSTIC STUDIES: Oxygen Saturation is 100% on RA, normal by my interpretation.    COORDINATION OF CARE: 4:47 PM Discussed next steps with pt and he agreed to plan.   Labs Review Labs Reviewed - No data to display  Imaging Review No results found.   EKG Interpretation None      Filed Vitals:   06/11/15 1637  BP: 165/120  Pulse: 92  Temp: 98.2 F (36.8 C)  TempSrc: Oral  Resp: 18  SpO2: 100%     MDM   Meds given in ED:  Medications - No data to display  New Prescriptions   BENZONATATE (TESSALON) 100 MG CAPSULE    Take 1 capsule (100 mg total) by mouth every 8 (eight) hours.   CETIRIZINE (ZYRTEC ALLERGY) 10 MG TABLET    Take 1 tablet (10 mg total) by mouth daily.   FLUTICASONE (FLONASE) 50 MCG/ACT NASAL SPRAY    Place 2 sprays into both nostrils daily.   NAPROXEN (NAPROSYN) 250 MG TABLET    Take 1 tablet (250 mg total) by mouth 2 (two) times daily with a meal.    Final diagnoses:  URI (upper respiratory infection)  Allergic rhinitis, unspecified allergic rhinitis type    This is a 39 y.o. male who presents to the Emergency Department complaining worsening nasal congestion with onset 1 week ago. Pt notes associated rhinorrhea, productive cough with yellow sputum, sore throat, chest tightness when coughing and post nasal drip. On exam the  patient is afebrile nontoxic appearing. Patient is hypertensive with a blood pressure 165/120. Patient has any headache, numbness or tingling. His lungs are clear to auscultation bilaterally. No wheezing, rhonchi. No increased work of breathing. Oxygen saturations 100% on room air. He has boggy nasal turbinates bilaterally. He has no tonsillar hypertrophy or exudates. I doubt strep throat or pneumonia. I see no need for chest x-ray at this time. Patient with upper respiratory infection. I discussed likely viral etiology. Will start the patient on Zyrtec, Flonase and Tessalon Perles for coughing. Also provided with naproxen for body aches as needed. I encouraged close follow-up with primary care to have his blood pressure rechecked and started on blood pressure medicine. I advised the patient to follow-up with their primary care provider this week. I advised the patient to return to the emergency department with new or worsening symptoms or new concerns. The patient verbalized understanding and agreement with plan.     I personally performed the services described in this documentation, which was scribed in my presence. The recorded information has been reviewed and is accurate.      Everlene FarrierWilliam Greysen Devino, PA-C 06/11/15 1704  Benjiman CoreNathan Pickering, MD  06/11/15 2342 

## 2015-06-11 NOTE — ED Notes (Signed)
Patient able to ambulate independently  

## 2015-06-11 NOTE — ED Notes (Signed)
Pt presents to ED for assessment for nasal congestion, post-nasal drip, sore throat, cough.  NAD

## 2015-06-11 NOTE — Discharge Instructions (Signed)
Please call and make an appointment for follow-up at the [facility] to have your blood pressure rechecked and to be restarted on blood pressure medicine. Upper Respiratory Infection, Adult Most upper respiratory infections (URIs) are a viral infection of the air passages leading to the lungs. A URI affects the nose, throat, and upper air passages. The most common type of URI is nasopharyngitis and is typically referred to as "the common cold." URIs run their course and usually go away on their own. Most of the time, a URI does not require medical attention, but sometimes a bacterial infection in the upper airways can follow a viral infection. This is called a secondary infection. Sinus and middle ear infections are common types of secondary upper respiratory infections. Bacterial pneumonia can also complicate a URI. A URI can worsen asthma and chronic obstructive pulmonary disease (COPD). Sometimes, these complications can require emergency medical care and may be life threatening.  CAUSES Almost all URIs are caused by viruses. A virus is a type of germ and can spread from one person to another.  RISKS FACTORS You may be at risk for a URI if:   You smoke.   You have chronic heart or lung disease.  You have a weakened defense (immune) system.   You are very young or very old.   You have nasal allergies or asthma.  You work in crowded or poorly ventilated areas.  You work in health care facilities or schools. SIGNS AND SYMPTOMS  Symptoms typically develop 2-3 days after you come in contact with a cold virus. Most viral URIs last 7-10 days. However, viral URIs from the influenza virus (flu virus) can last 14-18 days and are typically more severe. Symptoms may include:   Runny or stuffy (congested) nose.   Sneezing.   Cough.   Sore throat.   Headache.   Fatigue.   Fever.   Loss of appetite.   Pain in your forehead, behind your eyes, and over your cheekbones  (sinus pain).  Muscle aches.  DIAGNOSIS  Your health care provider may diagnose a URI by:  Physical exam.  Tests to check that your symptoms are not due to another condition such as:  Strep throat.  Sinusitis.  Pneumonia.  Asthma. TREATMENT  A URI goes away on its own with time. It cannot be cured with medicines, but medicines may be prescribed or recommended to relieve symptoms. Medicines may help:  Reduce your fever.  Reduce your cough.  Relieve nasal congestion. HOME CARE INSTRUCTIONS   Take medicines only as directed by your health care provider.   Gargle warm saltwater or take cough drops to comfort your throat as directed by your health care provider.  Use a warm mist humidifier or inhale steam from a shower to increase air moisture. This may make it easier to breathe.  Drink enough fluid to keep your urine clear or pale yellow.   Eat soups and other clear broths and maintain good nutrition.   Rest as needed.   Return to work when your temperature has returned to normal or as your health care provider advises. You may need to stay home longer to avoid infecting others. You can also use a face mask and careful hand washing to prevent spread of the virus.  Increase the usage of your inhaler if you have asthma.   Do not use any tobacco products, including cigarettes, chewing tobacco, or electronic cigarettes. If you need help quitting, ask your health care provider. PREVENTION  The best way to protect yourself from getting a cold is to practice good hygiene.   Avoid oral or hand contact with people with cold symptoms.   Wash your hands often if contact occurs.  There is no clear evidence that vitamin C, vitamin E, echinacea, or exercise reduces the chance of developing a cold. However, it is always recommended to get plenty of rest, exercise, and practice good nutrition.  SEEK MEDICAL CARE IF:   You are getting worse rather than better.   Your  symptoms are not controlled by medicine.   You have chills.  You have worsening shortness of breath.  You have brown or red mucus.  You have yellow or brown nasal discharge.  You have pain in your face, especially when you bend forward.  You have a fever.  You have swollen neck glands.  You have pain while swallowing.  You have white areas in the back of your throat. SEEK IMMEDIATE MEDICAL CARE IF:   You have severe or persistent:  Headache.  Ear pain.  Sinus pain.  Chest pain.  You have chronic lung disease and any of the following:  Wheezing.  Prolonged cough.  Coughing up blood.  A change in your usual mucus.  You have a stiff neck.  You have changes in your:  Vision.  Hearing.  Thinking.  Mood. MAKE SURE YOU:   Understand these instructions.  Will watch your condition.  Will get help right away if you are not doing well or get worse.   This information is not intended to replace advice given to you by your health care provider. Make sure you discuss any questions you have with your health care provider.   Document Released: 12/12/2000 Document Revised: 11/02/2014 Document Reviewed: 09/23/2013 Elsevier Interactive Patient Education 2016 [employer].  Allergic Rhinitis Allergic rhinitis is when the mucous membranes in the nose respond to allergens. Allergens are particles in the air that cause your body to have an allergic reaction. This causes you to release allergic antibodies. Through a chain of events, these eventually cause you to release histamine into the blood stream. Although meant to protect the body, it is this release of histamine that causes your discomfort, such as frequent sneezing, congestion, and an itchy, runny nose.  CAUSES Seasonal allergic rhinitis (hay fever) is caused by pollen allergens that may come from grasses, trees, and weeds. Year-round allergic rhinitis (perennial allergic rhinitis) is caused by allergens such as  house dust mites, pet dander, and mold spores. SYMPTOMS  Nasal stuffiness (congestion).  Itchy, runny nose with sneezing and tearing of the eyes. DIAGNOSIS Your health care provider can help you determine the allergen or allergens that trigger your symptoms. If you and your health care provider are unable to determine the allergen, skin or blood testing may be used. Your health care provider will diagnose your condition after taking your health history and performing a physical exam. Your health care provider may assess you for other related conditions, such as asthma, pink eye, or an ear infection. TREATMENT Allergic rhinitis does not have a cure, but it can be controlled by:  Medicines that block allergy symptoms. These may include allergy shots, nasal sprays, and oral antihistamines.  Avoiding the allergen. Hay fever may often be treated with antihistamines in pill or nasal spray forms. Antihistamines block the effects of histamine. There are over-the-counter medicines that may help with nasal congestion and swelling around the eyes. Check with your health care provider before taking or  giving this medicine. If avoiding the allergen or the medicine prescribed do not work, there are many new medicines your health care provider can prescribe. Stronger medicine may be used if initial measures are ineffective. Desensitizing injections can be used if medicine and avoidance does not work. Desensitization is when a patient is given ongoing shots until the body becomes less sensitive to the allergen. Make sure you follow up with your health care provider if problems continue. HOME CARE INSTRUCTIONS It is not possible to completely avoid allergens, but you can reduce your symptoms by taking steps to limit your exposure to them. It helps to know exactly what you are allergic to so that you can avoid your specific triggers. SEEK MEDICAL CARE IF:  You have a fever.  You develop a cough that does not  stop easily (persistent).  You have shortness of breath.  You start wheezing.  Symptoms interfere with normal daily activities.   This information is not intended to replace advice given to you by your health care provider. Make sure you discuss any questions you have with your health care provider.   Document Released: 03/13/2001 Document Revised: 07/09/2014 Document Reviewed: 02/23/2013 Elsevier Interactive Patient Education 2016 [employer] DASH Eating Plan DASH stands for "Dietary Approaches to Stop Hypertension." The DASH eating plan is a healthy eating plan that has been shown to reduce high blood pressure (hypertension). Additional health benefits may include reducing the risk of type 2 diabetes mellitus, heart disease, and stroke. The DASH eating plan may also help with weight loss. WHAT DO I NEED TO KNOW ABOUT THE DASH EATING PLAN? For the DASH eating plan, you will follow these general guidelines:  Choose foods with a percent daily value for sodium of less than 5% (as listed on the food label).  Use salt-free seasonings or herbs instead of table salt or sea salt.  Check with your health care provider or pharmacist before using salt substitutes.  Eat lower-sodium products, often labeled as "lower sodium" or "no salt added."  Eat fresh foods.  Eat more vegetables, fruits, and low-fat dairy products.  Choose whole grains. Look for the word "whole" as the first word in the ingredient list.  Choose fish and skinless chicken or [place] more often than red meat. Limit fish, poultry, and meat to 6 oz (170 g) each day.  Limit sweets, desserts, sugars, and sugary drinks.  Choose heart-healthy fats.  Limit cheese to 1 oz (28 g) per day.  Eat more home-cooked food and less restaurant, buffet, and fast food.  Limit fried foods.  Cook foods using methods other than frying.  Limit canned vegetables. If you do use them, rinse them well to decrease the sodium.  When eating  at a restaurant, ask that your food be prepared with less salt, or no salt if possible. WHAT FOODS CAN I EAT? Seek help from a dietitian for individual calorie needs. Grains Whole grain or whole wheat bread. Brown rice. Whole grain or whole wheat pasta. Quinoa, bulgur, and whole grain cereals. Low-sodium cereals. Corn or whole wheat flour tortillas. Whole grain cornbread. Whole grain crackers. Low-sodium crackers. Vegetables Fresh or frozen vegetables (raw, steamed, roasted, or grilled). Low-sodium or reduced-sodium tomato and vegetable juices. Low-sodium or reduced-sodium tomato sauce and paste. Low-sodium or reduced-sodium canned vegetables.  Fruits All fresh, canned (in natural juice), or frozen fruits. Meat and Other Protein Products Ground beef (85% or leaner), grass-fed beef, or beef trimmed of fat. Skinless chicken or [place]. Ground chicken or  [place]. Pork trimmed of fat. All fish and seafood. Eggs. Dried beans, peas, or lentils. Unsalted nuts and seeds. Unsalted canned beans. Dairy Low-fat dairy products, such as skim or 1% milk, 2% or reduced-fat cheeses, low-fat ricotta or cottage cheese, or plain low-fat yogurt. Low-sodium or reduced-sodium cheeses. Fats and Oils Tub margarines without trans fats. Light or reduced-fat mayonnaise and salad dressings (reduced sodium). Avocado. Safflower, olive, or canola oils. Natural peanut or almond butter. Other Unsalted popcorn and pretzels. The items listed above may not be a complete list of recommended foods or beverages. Contact your dietitian for more options. WHAT FOODS ARE NOT RECOMMENDED? Grains White bread. White pasta. White rice. Refined cornbread. Bagels and croissants. Crackers that contain trans fat. Vegetables Creamed or fried vegetables. Vegetables in a cheese sauce. Regular canned vegetables. Regular canned tomato sauce and paste. Regular tomato and vegetable juices. Fruits Dried fruits. Canned fruit in light or heavy syrup.  Fruit juice. Meat and Other Protein Products Fatty cuts of meat. Ribs, chicken wings, bacon, sausage, bologna, salami, chitterlings, fatback, hot dogs, bratwurst, and packaged luncheon meats. Salted nuts and seeds. Canned beans with salt. Dairy Whole or 2% milk, cream, half-and-half, and cream cheese. Whole-fat or sweetened yogurt. Full-fat cheeses or blue cheese. Nondairy creamers and whipped toppings. Processed cheese, cheese spreads, or cheese curds. Condiments Onion and garlic salt, seasoned salt, table salt, and sea salt. Canned and packaged gravies. Worcestershire sauce. Tartar sauce. Barbecue sauce. Teriyaki sauce. Soy sauce, including reduced sodium. Steak sauce. Fish sauce. Oyster sauce. Cocktail sauce. Horseradish. Ketchup and mustard. Meat flavorings and tenderizers. Bouillon cubes. Hot sauce. Tabasco sauce. Marinades. Taco seasonings. Relishes. Fats and Oils Butter, stick margarine, lard, shortening, ghee, and bacon fat. Coconut, palm kernel, or palm oils. Regular salad dressings. Other Pickles and olives. Salted popcorn and pretzels. The items listed above may not be a complete list of foods and beverages to avoid. Contact your dietitian for more information. WHERE CAN I FIND MORE INFORMATION? National Heart, Lung, and Blood Institute: [employer]   This information is not intended to replace advice given to you by your health care provider. Make sure you discuss any questions you have with your health care provider.   Document Released: 06/07/2011 Document Revised: 07/09/2014 Document Reviewed: 04/22/2013 Elsevier Interactive Patient Education [employer]

## 2015-08-04 ENCOUNTER — Emergency Department (HOSPITAL_COMMUNITY): Payer: Self-pay

## 2015-08-04 ENCOUNTER — Emergency Department (HOSPITAL_COMMUNITY)
Admission: EM | Admit: 2015-08-04 | Discharge: 2015-08-04 | Disposition: A | Discharge status: Home / Self Care | Payer: Self-pay | Attending: Emergency Medicine | Admitting: Emergency Medicine

## 2015-08-04 ENCOUNTER — Encounter (HOSPITAL_COMMUNITY): Payer: Self-pay

## 2015-08-04 DIAGNOSIS — Z791 Long term (current) use of non-steroidal anti-inflammatories (NSAID): Secondary | ICD-10-CM | POA: Insufficient documentation

## 2015-08-04 DIAGNOSIS — J069 Acute upper respiratory infection, unspecified: Secondary | ICD-10-CM | POA: Insufficient documentation

## 2015-08-04 DIAGNOSIS — Z7951 Long term (current) use of inhaled steroids: Secondary | ICD-10-CM | POA: Insufficient documentation

## 2015-08-04 DIAGNOSIS — Z79899 Other long term (current) drug therapy: Secondary | ICD-10-CM | POA: Insufficient documentation

## 2015-08-04 DIAGNOSIS — I10 Essential (primary) hypertension: Secondary | ICD-10-CM | POA: Insufficient documentation

## 2015-08-04 MED ORDER — NAPROXEN 500 MG PO TABS: 500.0000 mg | ORAL_TABLET | Freq: Two times a day (BID) | ORAL | Status: DC

## 2015-08-04 MED ORDER — FLUTICASONE PROPIONATE 50 MCG/ACT NA SUSP: 2.0000 | Freq: Every day | NASAL | Status: DC

## 2015-08-04 MED ORDER — IBUPROFEN 400 MG PO TABS
800.0000 mg | ORAL_TABLET | Freq: Once | ORAL | Status: AC
  Administered 2015-08-04: 800 mg via ORAL
  Filled 2015-08-04: qty 2

## 2015-08-04 MED ORDER — CETIRIZINE HCL 10 MG PO TABS: 10.0000 mg | ORAL_TABLET | Freq: Every day | ORAL | Status: DC

## 2015-08-04 MED ORDER — BENZONATATE 100 MG PO CAPS: 100.0000 mg | ORAL_CAPSULE | Freq: Three times a day (TID) | ORAL | Status: DC | PRN

## 2015-08-04 NOTE — Discharge Instructions (Signed)
Upper Respiratory Infection, Adult Most upper respiratory infections (URIs) are a viral infection of the air passages leading to the lungs. A URI affects the nose, throat, and upper air passages. The most common type of URI is nasopharyngitis and is typically referred to as "the common cold." URIs run their course and usually go away on their own. Most of the time, a URI does not require medical attention, but sometimes a bacterial infection in the upper airways can follow a viral infection. This is called a secondary infection. Sinus and middle ear infections are common types of secondary upper respiratory infections. Bacterial pneumonia can also complicate a URI. A URI can worsen asthma and chronic obstructive pulmonary disease (COPD). Sometimes, these complications can require emergency medical care and may be life threatening.  CAUSES Almost all URIs are caused by viruses. A virus is a type of germ and can spread from one person to another.  RISKS FACTORS You may be at risk for a URI if:   You smoke.   You have chronic heart or lung disease.  You have a weakened defense (immune) system.   You are very young or very old.   You have nasal allergies or asthma.  You work in crowded or poorly ventilated areas.  You work in health care facilities or schools. SIGNS AND SYMPTOMS  Symptoms typically develop 2-3 days after you come in contact with a cold virus. Most viral URIs last 7-10 days. However, viral URIs from the influenza virus (flu virus) can last 14-18 days and are typically more severe. Symptoms may include:   Runny or stuffy (congested) nose.   Sneezing.   Cough.   Sore throat.   Headache.   Fatigue.   Fever.   Loss of appetite.   Pain in your forehead, behind your eyes, and over your cheekbones (sinus pain).  Muscle aches.  DIAGNOSIS  Your health care provider may diagnose a URI by:  Physical exam.  Tests to check that your symptoms are not due to  another condition such as:  Strep throat.  Sinusitis.  Pneumonia.  Asthma. TREATMENT  A URI goes away on its own with time. It cannot be cured with medicines, but medicines may be prescribed or recommended to relieve symptoms. Medicines may help:  Reduce your fever.  Reduce your cough.  Relieve nasal congestion. HOME CARE INSTRUCTIONS   Take medicines only as directed by your health care provider.   Gargle warm saltwater or take cough drops to comfort your throat as directed by your health care provider.  Use a warm mist humidifier or inhale steam from a shower to increase air moisture. This may make it easier to breathe.  Drink enough fluid to keep your urine clear or pale yellow.   Eat soups and other clear broths and maintain good nutrition.   Rest as needed.   Return to work when your temperature has returned to normal or as your health care provider advises. You may need to stay home longer to avoid infecting others. You can also use a face mask and careful hand washing to prevent spread of the virus.  Increase the usage of your inhaler if you have asthma.   Do not use any tobacco products, including cigarettes, chewing tobacco, or electronic cigarettes. If you need help quitting, ask your health care provider. PREVENTION  The best way to protect yourself from getting a cold is to practice good hygiene.   Avoid oral or hand contact with people with cold   symptoms.   Wash your hands often if contact occurs.  There is no clear evidence that vitamin C, vitamin E, echinacea, or exercise reduces the chance of developing a cold. However, it is always recommended to get plenty of rest, exercise, and practice good nutrition.  SEEK MEDICAL CARE IF:   You are getting worse rather than better.   Your symptoms are not controlled by medicine.   You have chills.  You have worsening shortness of breath.  You have brown or red mucus.  You have yellow or brown nasal  discharge.  You have pain in your face, especially when you bend forward.  You have a fever.  You have swollen neck glands.  You have pain while swallowing.  You have white areas in the back of your throat. SEEK IMMEDIATE MEDICAL CARE IF:   You have severe or persistent:  Headache.  Ear pain.  Sinus pain.  Chest pain.  You have chronic lung disease and any of the following:  Wheezing.  Prolonged cough.  Coughing up blood.  A change in your usual mucus.  You have a stiff neck.  You have changes in your:  Vision.  Hearing.  Thinking.  Mood. MAKE SURE YOU:   Understand these instructions.  Will watch your condition.  Will get help right away if you are not doing well or get worse.   This information is not intended to replace advice given to you by your health care provider. Make sure you discuss any questions you have with your health care provider.   Document Released: 12/12/2000 Document Revised: 11/02/2014 Document Reviewed: 09/23/2013 Elsevier Interactive Patient Education 2016 Elsevier Inc.  

## 2015-08-04 NOTE — ED Notes (Signed)
Patient here with cough and congestion x 4 days, reports coughing up greenish sputum, no distress

## 2015-08-04 NOTE — ED Provider Notes (Signed)
CSN: [id number]     Arrival date & time 08/04/15  1215 History  By signing my name below, I, Donald Carter, attest that this documentation has been prepared under the direction and in the presence of Cheri Fowler, PA-C Electronically Signed: Charline Bills, ED Scribe 08/04/2015 at 1:09 PM.   Chief Complaint  Patient presents with  . Cough  . Nasal Congestion   The history is provided by the patient. No language interpreter was used.   HPI Comments: Donald Carter is a 40 y.o. male, with a h/o HTN, who presents to the Emergency Department complaining of productive cough with green sputum for the past 4 days. Pt reports associated nasal congestion, rhinorrhea, sore throat, HA, intermittent left-sided sharp chest pain when coughing. He describes pain as soreness similar to soreness after exercising. He has no pain at this time.  He also reports similar symptoms with an URI a few months ago. Pt has tried Halls cough drops with temporary relief. He denies fever, ear pain, neck stiffness, unilateral leg swelling, generalized body aches. No h/o DVT/PE. No recent surgery, trauma, injury. Pt is a nonsmoker.  Past Medical History  Diagnosis Date  . Hypertension    History reviewed. No pertinent past surgical history. No family history on file. Social History  Substance Use Topics  . Smoking status: Never Smoker   . Smokeless tobacco: None  . Alcohol Use: Yes     Comment: ocasional    Review of Systems  Constitutional: Negative for fever.  HENT: Positive for congestion, rhinorrhea and sore throat. Negative for ear pain.   Respiratory: Positive for cough.   Cardiovascular: Negative for chest pain and leg swelling.  Musculoskeletal: Negative for myalgias and neck stiffness.  Neurological: Positive for headaches.  All other systems reviewed and are negative.  Allergies  Review of patient's allergies indicates no known allergies.  Home Medications   Prior to Admission medications    Medication Sig Start Date End Date Taking? Authorizing Provider  amLODipine (NORVASC) 10 MG tablet Take 1 tablet (10 mg total) by mouth daily. Patient not taking: Reported on 04/04/2015 11/10/14   Francee Piccolo, PA-C  benzonatate (TESSALON) 100 MG capsule Take 1 capsule (100 mg total) by mouth every 8 (eight) hours. 06/11/15   Everlene Farrier, PA-C  cetirizine (ZYRTEC ALLERGY) 10 MG tablet Take 1 tablet (10 mg total) by mouth daily. 06/11/15   Everlene Farrier, PA-C  fluticasone (FLONASE) 50 MCG/ACT nasal spray Place 2 sprays into both nostrils daily. 06/11/15   Everlene Farrier, PA-C  naproxen (NAPROSYN) 250 MG tablet Take 1 tablet (250 mg total) by mouth 2 (two) times daily with a meal. 06/11/15   Everlene Farrier, PA-C   BP 149/102 mmHg  Pulse 99  Temp(Src) 98.4 F (36.9 C) (Oral)  Resp 18  Ht 6' (1.829 m)  Wt 195 lb (88.451 kg)  BMI 26.44 kg/m2  SpO2 97% Physical Exam  Constitutional: He is oriented to person, place, and time. He appears well-developed and well-nourished.  Non-toxic appearance. He does not have a sickly appearance. He does not appear ill.  HENT:  Head: Normocephalic and atraumatic.  Nose: Nose normal.  Mouth/Throat: Oropharynx is clear and moist.  Eyes: Conjunctivae are normal. Pupils are equal, round, and reactive to light.  Neck: Normal range of motion. Neck supple.  Cardiovascular: Normal rate, regular rhythm and normal heart sounds.   No murmur heard. Pulmonary/Chest: Effort normal and breath sounds normal. No accessory muscle usage or stridor. No respiratory distress. He  has no wheezes. He has no rhonchi. He has no rales.  Abdominal: Soft. Bowel sounds are normal. He exhibits no distension. There is no tenderness.  Musculoskeletal: Normal range of motion.  Lymphadenopathy:    He has no cervical adenopathy.  Neurological: He is alert and oriented to person, place, and time.  Speech clear without dysarthria.  Skin: Skin is warm and dry.  Psychiatric: He has  a normal mood and affect. His behavior is normal.   ED Course  Procedures (including critical care time) DIAGNOSTIC STUDIES: Oxygen Saturation is 97% on RA, normal by my interpretation.    COORDINATION OF CARE: 12:31 PM-Discussed treatment plan which includes CXR and ibuprofen with pt at bedside and pt agreed to plan.   Labs Review Labs Reviewed - No data to display  Imaging Review Dg Chest 2 View  08/04/2015  CLINICAL DATA:  Left lateral chest pain and cough for 4 days. Initial encounter. EXAM: CHEST  2 VIEW COMPARISON:  PA and lateral chest 01/08/2015 and 11/10/2014. FINDINGS: The lungs are clear. Heart size is normal. There is no pneumothorax or pleural effusion. No bony abnormality is identified. IMPRESSION: Normal chest. Electronically Signed   By: Drusilla Kanner M.D.   On: 08/04/2015 13:02   I have personally reviewed and evaluated these images and lab results as part of my medical decision-making.   EKG Interpretation None      MDM   Final diagnoses:  URI (upper respiratory infection)    Patient presents with sxs consistent with URI.  VSS, NAD.  Patient appears non-toxic.  Lungs CTAB, oropharynx without exudates. CENTOR criteria 0, no indication for rapid strep testing.  CXR obtained to r/o PNA.  Anticipate discharge home with flonase, zyrtec, naproxen, and tessalon perle. No indication for antibiotics at this time.  Follow up [facility].  Discussed return precautions.  Patient agrees and acknowledges the above plan for discharge.   I personally performed the services described in this documentation, which was scribed in my presence. The recorded information has been reviewed and is accurate.       Cheri Fowler, PA-C 08/04/15 1315  Loren Racer, MD 08/04/15 1540

## 2015-09-05 ENCOUNTER — Emergency Department (HOSPITAL_COMMUNITY)
Admission: EM | Admit: 2015-09-05 | Discharge: 2015-09-05 | Disposition: A | Discharge status: Home / Self Care | Payer: Self-pay | Attending: Physician Assistant | Admitting: Physician Assistant

## 2015-09-05 ENCOUNTER — Emergency Department (HOSPITAL_COMMUNITY): Payer: Self-pay

## 2015-09-05 ENCOUNTER — Encounter (HOSPITAL_COMMUNITY): Payer: Self-pay

## 2015-09-05 DIAGNOSIS — Z7951 Long term (current) use of inhaled steroids: Secondary | ICD-10-CM | POA: Insufficient documentation

## 2015-09-05 DIAGNOSIS — B349 Viral infection, unspecified: Secondary | ICD-10-CM

## 2015-09-05 DIAGNOSIS — I10 Essential (primary) hypertension: Secondary | ICD-10-CM | POA: Insufficient documentation

## 2015-09-05 DIAGNOSIS — Z79899 Other long term (current) drug therapy: Secondary | ICD-10-CM | POA: Insufficient documentation

## 2015-09-05 DIAGNOSIS — Z791 Long term (current) use of non-steroidal anti-inflammatories (NSAID): Secondary | ICD-10-CM | POA: Insufficient documentation

## 2015-09-05 MED ORDER — ACETAMINOPHEN 325 MG PO TABS
ORAL_TABLET | ORAL | Status: AC
  Filled 2015-09-05: qty 2

## 2015-09-05 MED ORDER — ACETAMINOPHEN 325 MG PO TABS
650.0000 mg | ORAL_TABLET | Freq: Once | ORAL | Status: AC
  Administered 2015-09-05: 650 mg via ORAL

## 2015-09-05 MED ORDER — ACETAMINOPHEN 325 MG PO TABS: 325.0000 mg | ORAL_TABLET | Freq: Once | ORAL | Status: DC

## 2015-09-05 NOTE — ED Notes (Signed)
Patient transported to X-ray 

## 2015-09-05 NOTE — Discharge Instructions (Signed)
Viral Infections °A viral infection can be caused by different types of viruses. Most viral infections are not serious and resolve on their own. However, some infections may cause severe symptoms and may lead to further complications. °SYMPTOMS °Viruses can frequently cause: °· Minor sore throat. °· Aches and pains. °· Headaches. °· Runny nose. °· Different types of rashes. °· Watery eyes. °· Tiredness. °· Cough. °· Loss of appetite. °· Gastrointestinal infections, resulting in nausea, vomiting, and diarrhea. °These symptoms do not respond to antibiotics because the infection is not caused by bacteria. However, you might catch a bacterial infection following the viral infection. This is sometimes called a "superinfection." Symptoms of such a bacterial infection may include: °· Worsening sore throat with pus and difficulty swallowing. °· Swollen neck glands. °· Chills and a high or persistent fever. °· Severe headache. °· Tenderness over the sinuses. °· Persistent overall ill feeling (malaise), muscle aches, and tiredness (fatigue). °· Persistent cough. °· Yellow, green, or brown mucus production with coughing. °HOME CARE INSTRUCTIONS  °· Only take over-the-counter or prescription medicines for pain, discomfort, diarrhea, or fever as directed by your caregiver. °· Drink enough water and fluids to keep your urine clear or pale yellow. Sports drinks can provide valuable electrolytes, sugars, and hydration. °· Get plenty of rest and maintain proper nutrition. Soups and broths with crackers or rice are fine. °SEEK IMMEDIATE MEDICAL CARE IF:  °· You have severe headaches, shortness of breath, chest pain, neck pain, or an unusual rash. °· You have uncontrolled vomiting, diarrhea, or you are unable to keep down fluids. °· You or your child has an oral temperature above 102° F (38.9° C), not controlled by medicine. °· Your baby is older than 3 months with a rectal temperature of 102° F (38.9° C) or higher. °· Your baby is 3  months old or younger with a rectal temperature of 100.4° F (38° C) or higher. °MAKE SURE YOU:  °· Understand these instructions. °· Will watch your condition. °· Will get help right away if you are not doing well or get worse. °  °This information is not intended to replace advice given to you by your health care provider. Make sure you discuss any questions you have with your health care provider. °  °Document Released: 03/28/2005 Document Revised: 09/10/2011 Document Reviewed: 11/24/2014 °Elsevier Interactive Patient Education ©2016 Elsevier Inc. ° °

## 2015-09-05 NOTE — ED Notes (Signed)
Patient here with body aches, cough, congestion and fever x 4 days, no distress

## 2015-09-05 NOTE — ED Notes (Signed)
Declined W/C at D/C and was escorted to lobby by RN. 

## 2015-09-05 NOTE — ED Provider Notes (Signed)
CSN: [id number]     Arrival date & time 09/05/15  1411 History  By signing my name below, I, [name], attest that this documentation has been prepared under the direction and in the presence of Eyvonne Mechanic, PA-C Electronically Signed: Charline Bills, ED Scribe 09/05/2015 at 3:58 PM.   Chief Complaint  Patient presents with  . Fever  . Cough   The history is provided by the patient. No language interpreter was used.   HPI Comments: Donald Carter is a 40 y.o. male , with a h/o HTN, who presents to the Emergency Department complaining of a fever with a Tmax of 101.2 F upon arrival. Pt reports associated fatigue, sore throat, congestion that is worse at night, dry cough for the past 4 days. He has tried Alka seltzer sever cold and Tylenol sever cold and flu without significant relief. He denies rhinorrhea, neck stiffness, vomiting, shortness breath, chest pain. Patient has no chronic conditions other than well-managed hypertension. Nonsmoker.  Past Medical History  Diagnosis Date  . Hypertension    History reviewed. No pertinent past surgical history. No family history on file. Social History  Substance Use Topics  . Smoking status: Never Smoker   . Smokeless tobacco: None  . Alcohol Use: Yes     Comment: ocasional    Review of Systems A complete 10 system review of systems was obtained and all systems are negative except as noted in the HPI and PMH.   Allergies  Review of patient's allergies indicates no known allergies.  Home Medications   Prior to Admission medications   Medication Sig Start Date End Date Taking? Authorizing Provider  amLODipine (NORVASC) 10 MG tablet Take 1 tablet (10 mg total) by mouth daily. Patient not taking: Reported on 04/04/2015 11/10/14   Francee Piccolo, PA-C  benzonatate (TESSALON) 100 MG capsule Take 1 capsule (100 mg total) by mouth 3 (three) times daily as needed for cough. 08/04/15   Cheri Fowler, PA-C  cetirizine (ZYRTEC) 10 MG tablet  Take 1 tablet (10 mg total) by mouth daily. 08/04/15   Cheri Fowler, PA-C  fluticasone (FLONASE) 50 MCG/ACT nasal spray Place 2 sprays into both nostrils daily. 08/04/15   Cheri Fowler, PA-C  naproxen (NAPROSYN) 500 MG tablet Take 1 tablet (500 mg total) by mouth 2 (two) times daily. 08/04/15   Kayla Rose, PA-C   BP 142/95 mmHg  Pulse 108  Temp(Src) 99.6 F (37.6 C) (Oral)  Resp 16  Ht 6' (1.829 m)  Wt 187 lb (84.823 kg)  BMI 25.36 kg/m2  SpO2 98% Physical Exam  Constitutional: He is oriented to person, place, and time. He appears well-developed and well-nourished. No distress.  HENT:  Head: Normocephalic and atraumatic.  Eyes: Conjunctivae and EOM are normal.  Neck: Neck supple. No tracheal deviation present.  Cardiovascular: Normal rate, regular rhythm, normal heart sounds and intact distal pulses.  Exam reveals no gallop and no friction rub.   No murmur heard. Pulmonary/Chest: Effort normal and breath sounds normal. No respiratory distress. He has no wheezes. He has no rales.  Musculoskeletal: Normal range of motion.  Neurological: He is alert and oriented to person, place, and time.  Skin: Skin is warm and dry.  Psychiatric: He has a normal mood and affect. His behavior is normal.  Nursing note and vitals reviewed.  ED Course  Procedures (including critical care time) DIAGNOSTIC STUDIES: Oxygen Saturation is 98% on RA, normal by my interpretation.    COORDINATION OF CARE: 3:56 PM-Discussed treatment  plan which includes CXR and Tylenol with pt at bedside and pt agreed to plan.   Labs Review Labs Reviewed - No data to display  Imaging Review Dg Chest 2 View  09/05/2015  CLINICAL DATA:  Nonproductive cough, body aches, sore throat and fever for 3 days. Initial encounter. EXAM: CHEST  2 VIEW COMPARISON:  PA and lateral chest 08/04/2015 and 01/08/2015. FINDINGS: The lungs are clear. Heart size is normal. No pneumothorax or pleural effusion. No focal bony abnormality. IMPRESSION: Negative  chest. Electronically Signed   By: Drusilla Kannerhomas  Dalessio M.D.   On: 09/05/2015 15:46   I have personally reviewed and evaluated these images and lab results as part of my medical decision-making.   EKG Interpretation None      MDM   Final diagnoses:  Viral infection  Labs:  Imaging: DG chest 2 view negative  Consults:  Therapeutics:  Discharge Meds:   Assessment/Plan: 40 year old male presents today with likely viral infection. He is afebrile, nontoxic in no acute distress. Patient is otherwise healthy male, he will be discharged home with some Medicare instructions, return precautions. Patient verbalized understanding and agreement to today's plan had no further questions or concerns at time of discharge  I personally performed the services described in this documentation, which was scribed in my presence. The recorded information has been reviewed and is accurate.         Eyvonne MechanicJeffrey Doloris Servantes, PA-C 09/05/15 1737  Courteney Randall AnLyn Mackuen, MD 09/06/15 [phone]

## 2015-11-19 ENCOUNTER — Emergency Department (HOSPITAL_COMMUNITY)
Admission: EM | Admit: 2015-11-19 | Discharge: 2015-11-19 | Disposition: A | Discharge status: Home / Self Care | Payer: Self-pay | Attending: Emergency Medicine | Admitting: Emergency Medicine

## 2015-11-19 ENCOUNTER — Encounter (HOSPITAL_COMMUNITY): Payer: Self-pay

## 2015-11-19 DIAGNOSIS — T304 Corrosion of unspecified body region, unspecified degree: Secondary | ICD-10-CM

## 2015-11-19 DIAGNOSIS — T25532A Corrosion of first degree of left toe(s) (nail), initial encounter: Secondary | ICD-10-CM | POA: Insufficient documentation

## 2015-11-19 DIAGNOSIS — Y9389 Activity, other specified: Secondary | ICD-10-CM | POA: Insufficient documentation

## 2015-11-19 DIAGNOSIS — Z23 Encounter for immunization: Secondary | ICD-10-CM | POA: Insufficient documentation

## 2015-11-19 DIAGNOSIS — Y990 Civilian activity done for income or pay: Secondary | ICD-10-CM | POA: Insufficient documentation

## 2015-11-19 DIAGNOSIS — T543X1A Toxic effect of corrosive alkalis and alkali-like substances, accidental (unintentional), initial encounter: Secondary | ICD-10-CM | POA: Insufficient documentation

## 2015-11-19 DIAGNOSIS — T2155XA Corrosion of first degree of buttock, initial encounter: Secondary | ICD-10-CM | POA: Insufficient documentation

## 2015-11-19 DIAGNOSIS — T24502A Corrosion of first degree of unspecified site of left lower limb, except ankle and foot, initial encounter: Secondary | ICD-10-CM | POA: Insufficient documentation

## 2015-11-19 DIAGNOSIS — Y9289 Other specified places as the place of occurrence of the external cause: Secondary | ICD-10-CM | POA: Insufficient documentation

## 2015-11-19 DIAGNOSIS — Z79899 Other long term (current) drug therapy: Secondary | ICD-10-CM | POA: Insufficient documentation

## 2015-11-19 DIAGNOSIS — X58XXXA Exposure to other specified factors, initial encounter: Secondary | ICD-10-CM | POA: Insufficient documentation

## 2015-11-19 DIAGNOSIS — I10 Essential (primary) hypertension: Secondary | ICD-10-CM | POA: Insufficient documentation

## 2015-11-19 DIAGNOSIS — T320 Corrosions involving less than 10% of body surface: Secondary | ICD-10-CM | POA: Insufficient documentation

## 2015-11-19 MED ORDER — TETANUS-DIPHTH-ACELL PERTUSSIS 5-2.5-18.5 LF-MCG/0.5 IM SUSP
0.5000 mL | Freq: Once | INTRAMUSCULAR | Status: AC
  Administered 2015-11-19: 0.5 mL via INTRAMUSCULAR
  Filled 2015-11-19: qty 0.5

## 2015-11-19 MED ORDER — SULFAMETHOXAZOLE-TRIMETHOPRIM 800-160 MG PO TABS: 1.0000 | ORAL_TABLET | Freq: Two times a day (BID) | ORAL | Status: AC

## 2015-11-19 MED ORDER — SULFAMETHOXAZOLE-TRIMETHOPRIM 800-160 MG PO TABS
1.0000 | ORAL_TABLET | Freq: Once | ORAL | Status: AC
  Administered 2015-11-19: 1 via ORAL
  Filled 2015-11-19: qty 1

## 2015-11-19 MED ORDER — SILVER SULFADIAZINE 1 % EX CREA
TOPICAL_CREAM | Freq: Once | CUTANEOUS | Status: AC
  Administered 2015-11-19: 12:00:00 via TOPICAL
  Filled 2015-11-19: qty 85
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
## 2015-11-19 NOTE — Discharge Instructions (Signed)
Take the prescribed medication as directed.  Continue using silvadene cream twice daily at home.  Monitor wounds for further redness, drainage, etc. Follow-up with the wound clinic if any worsening signs of infection. Return to the ED for new or worsening symptoms.
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
## 2015-11-19 NOTE — ED Notes (Signed)
Declined W/C at D/C and was escorted to lobby by RN. 

## 2015-12-19 ENCOUNTER — Emergency Department (HOSPITAL_COMMUNITY)
Admission: EM | Admit: 2015-12-19 | Discharge: 2015-12-19 | Disposition: A | Discharge status: Home / Self Care | Payer: Self-pay | Attending: Emergency Medicine | Admitting: Emergency Medicine

## 2015-12-19 ENCOUNTER — Encounter (HOSPITAL_COMMUNITY): Payer: Self-pay | Admitting: Emergency Medicine

## 2015-12-19 DIAGNOSIS — M545 Low back pain, unspecified: Secondary | ICD-10-CM

## 2015-12-19 DIAGNOSIS — Z79899 Other long term (current) drug therapy: Secondary | ICD-10-CM | POA: Insufficient documentation

## 2015-12-19 DIAGNOSIS — I10 Essential (primary) hypertension: Secondary | ICD-10-CM | POA: Insufficient documentation

## 2015-12-19 MED ORDER — LIDOCAINE 5 % EX PTCH: 1.0000 | MEDICATED_PATCH | CUTANEOUS | Status: DC

## 2015-12-19 MED ORDER — OXYCODONE-ACETAMINOPHEN 5-325 MG PO TABS: 1.0000 | ORAL_TABLET | ORAL | Status: DC | PRN

## 2015-12-19 MED ORDER — KETOROLAC TROMETHAMINE 60 MG/2ML IM SOLN
60.0000 mg | Freq: Once | INTRAMUSCULAR | Status: AC
  Administered 2015-12-19: 60 mg via INTRAMUSCULAR
  Filled 2015-12-19: qty 2

## 2015-12-19 MED ORDER — LIDOCAINE 5 % EX PTCH
1.0000 | MEDICATED_PATCH | CUTANEOUS | Status: DC
  Administered 2015-12-19: 1 via TRANSDERMAL
  Filled 2015-12-19: qty 1

## 2015-12-19 MED ORDER — METHOCARBAMOL 500 MG PO TABS
1000.0000 mg | ORAL_TABLET | Freq: Once | ORAL | Status: AC
  Administered 2015-12-19: 1000 mg via ORAL
  Filled 2015-12-19: qty 2

## 2015-12-19 MED ORDER — NAPROXEN 500 MG PO TABS: 500.0000 mg | ORAL_TABLET | Freq: Two times a day (BID) | ORAL | Status: DC

## 2015-12-19 MED ORDER — METHOCARBAMOL 500 MG PO TABS: 500.0000 mg | ORAL_TABLET | Freq: Two times a day (BID) | ORAL | Status: DC

## 2015-12-19 MED ORDER — OXYCODONE-ACETAMINOPHEN 5-325 MG PO TABS
1.0000 | ORAL_TABLET | Freq: Once | ORAL | Status: AC
  Administered 2015-12-19: 1 via ORAL
  Filled 2015-12-19: qty 1

## 2015-12-19 NOTE — Discharge Instructions (Signed)
You have been seen today for back pain. Expect your soreness to increase over the next 2-3 days. Take it easy, but do not lay around too much as this may make the stiffness worse. Take 500 mg of naproxen every 12 hours or 800 mg of ibuprofen every 8 hours for the next 3 days. Take these medications with food to avoid upset stomach. Robaxin is a muscle relaxer and may help loosen stiff muscles. Percocet for severe pain. Do not take the Robaxin or Percocet while driving or performing other dangerous activities. Follow up with PCP as needed should symptoms continue. Return to ED should symptoms worsen.

## 2015-12-19 NOTE — ED Notes (Signed)
Declined W/C at D/C and was escorted to lobby by RN. 

## 2015-12-19 NOTE — ED Notes (Signed)
Pt states had to pick up girlfriends mother yesterday and put her in a w/c- feels like something pulled in low back right side . No radiation.

## 2015-12-19 NOTE — ED Provider Notes (Signed)
CSN: [id number]     Arrival date & time 12/19/15  1111 History  By signing my name below, I, Donald Carter, attest that this documentation has been prepared under the direction and in the presence of Harolyn Rutherford, PA-C Electronically Signed: Charline Bills, ED Scribe 12/20/2015 at 12:18 PM.   Chief Complaint  Patient presents with  . Back Pain   The history is provided by the patient. No language interpreter was used.   HPI Comments: DEAVIN FORST is a 40 y.o. male who presents to the Emergency Department complaining of gradually worsening, non-radiating right low back pain onset yesterday. Pt states that he assisted a family member weighing approximately 180 lbs into a wheelchair who experienced a syncopal episode yesterday. Pt states that back pain is exacerbated with bending and he rates pain as 8/10 only with bending, but 3/10 while at rest. No treatments tried PTA.  Patient denies neuro deficits, falls, changes in bowel or bladder function, or any other complaints.  Past Medical History  Diagnosis Date  . Hypertension    History reviewed. No pertinent past surgical history. No family history on file. Social History  Substance Use Topics  . Smoking status: Never Smoker   . Smokeless tobacco: None  . Alcohol Use: Yes     Comment: ocasional    Review of Systems  Musculoskeletal: Positive for back pain.  Neurological: Negative for weakness and numbness.   Allergies  Review of patient's allergies indicates no known allergies.  Home Medications   Prior to Admission medications   Medication Sig Start Date End Date Taking? Authorizing Provider  amLODipine (NORVASC) 10 MG tablet Take 1 tablet (10 mg total) by mouth daily. Patient not taking: Reported on 04/04/2015 11/10/14   Francee Piccolo, PA-C  benzonatate (TESSALON) 100 MG capsule Take 1 capsule (100 mg total) by mouth 3 (three) times daily as needed for cough. 08/04/15   Cheri Fowler, PA-C  cetirizine (ZYRTEC) 10 MG tablet  Take 1 tablet (10 mg total) by mouth daily. 08/04/15   Cheri Fowler, PA-C  fluticasone (FLONASE) 50 MCG/ACT nasal spray Place 2 sprays into both nostrils daily. 08/04/15   Cheri Fowler, PA-C  lidocaine (LIDODERM) 5 % Place 1 patch onto the skin daily. Remove & Discard patch within 12 hours or as directed by MD 12/19/15   Anselm Pancoast, PA-C  methocarbamol (ROBAXIN) 500 MG tablet Take 1 tablet (500 mg total) by mouth 2 (two) times daily. 12/19/15   Franky Reier C Delynn Olvera, PA-C  naproxen (NAPROSYN) 500 MG tablet Take 1 tablet (500 mg total) by mouth 2 (two) times daily. 08/04/15   Cheri Fowler, PA-C  naproxen (NAPROSYN) 500 MG tablet Take 1 tablet (500 mg total) by mouth 2 (two) times daily. 12/19/15   Elior Robinette C Denym Christenberry, PA-C  oxyCODONE-acetaminophen (PERCOCET/ROXICET) 5-325 MG tablet Take 1-2 tablets by mouth every 4 (four) hours as needed for severe pain. 12/19/15   Olivier Frayre C Leone Mobley, PA-C   BP 153/92 mmHg  Pulse 89  Temp(Src) 98.2 F (36.8 C) (Oral)  Resp 17  Ht 6' (1.829 m)  Wt 195 lb (88.451 kg)  BMI 26.44 kg/m2  SpO2 100% Physical Exam  Constitutional: He appears well-developed and well-nourished. No distress.  HENT:  Head: Normocephalic and atraumatic.  Eyes: Conjunctivae are normal.  Neck: Neck supple.  Cardiovascular: Normal rate, regular rhythm and intact distal pulses.   Pulmonary/Chest: Effort normal.  Musculoskeletal: Normal range of motion.  Tenderness to the R lumbar musculature. Full ROM in all extremities  and spine. No paraspinal tenderness.   Neurological: He is alert. He has normal reflexes.  No sensory deficits. Strength 5/5 in all extremities. No gait disturbance. Coordination intact.   Skin: Skin is warm and dry. He is not diaphoretic.  Psychiatric: He has a normal mood and affect. His behavior is normal.  Nursing note and vitals reviewed.  ED Course  Procedures (including critical care time) DIAGNOSTIC STUDIES: Oxygen Saturation is 100% on RA, normal by my interpretation.    COORDINATION OF  CARE: 12:11 PM-Discussed treatment plan which includes Lidoderm, Robaxin, Percocet, Toradol injection with pt at bedside and pt agreed to plan.    MDM   Final diagnoses:  Right-sided low back pain without sciatica    Thelma Bargeerrence R Stemen presents with lower right back pain after a lifting injury that occurred yesterday.  Patient's presentation is consistent with lumbar strain. Improvement with conservative management. Home care and return precautions discussed. Patient voiced understanding of all instructions and is comfortable with discharge.  I personally performed the services described in this documentation, which was scribed in my presence. The recorded information has been reviewed and is accurate.   Anselm PancoastShawn C Jesson Foskey, PA-C 12/20/15 [id number]  Azalia BilisKevin Campos, MD 12/20/15 [phone]

## 2016-06-22 ENCOUNTER — Emergency Department (HOSPITAL_COMMUNITY)
Admission: EM | Admit: 2016-06-22 | Discharge: 2016-06-22 | Disposition: A | Discharge status: Home / Self Care | Payer: Self-pay | Attending: Emergency Medicine | Admitting: Emergency Medicine

## 2016-06-22 ENCOUNTER — Emergency Department (HOSPITAL_COMMUNITY): Payer: Self-pay

## 2016-06-22 ENCOUNTER — Encounter (HOSPITAL_COMMUNITY): Payer: Self-pay | Admitting: Emergency Medicine

## 2016-06-22 DIAGNOSIS — R079 Chest pain, unspecified: Secondary | ICD-10-CM

## 2016-06-22 DIAGNOSIS — R0789 Other chest pain: Secondary | ICD-10-CM | POA: Insufficient documentation

## 2016-06-22 DIAGNOSIS — I10 Essential (primary) hypertension: Secondary | ICD-10-CM | POA: Insufficient documentation

## 2016-06-22 DIAGNOSIS — I639 Cerebral infarction, unspecified: Secondary | ICD-10-CM

## 2016-06-22 DIAGNOSIS — R739 Hyperglycemia, unspecified: Secondary | ICD-10-CM | POA: Insufficient documentation

## 2016-06-22 DIAGNOSIS — Z79899 Other long term (current) drug therapy: Secondary | ICD-10-CM | POA: Insufficient documentation

## 2016-06-22 DIAGNOSIS — R29898 Other symptoms and signs involving the musculoskeletal system: Secondary | ICD-10-CM

## 2016-06-22 DIAGNOSIS — R930 Abnormal findings on diagnostic imaging of skull and head, not elsewhere classified: Secondary | ICD-10-CM | POA: Insufficient documentation

## 2016-06-22 DIAGNOSIS — M6281 Muscle weakness (generalized): Secondary | ICD-10-CM | POA: Insufficient documentation

## 2016-06-22 DIAGNOSIS — Z5181 Encounter for therapeutic drug level monitoring: Secondary | ICD-10-CM | POA: Insufficient documentation

## 2016-06-22 LAB — RAPID URINE DRUG SCREEN, HOSP PERFORMED
Amphetamines: NOT DETECTED
BARBITURATES: NOT DETECTED
Benzodiazepines: NOT DETECTED
COCAINE: NOT DETECTED
OPIATES: NOT DETECTED
Tetrahydrocannabinol: NOT DETECTED

## 2016-06-22 LAB — URINALYSIS, ROUTINE W REFLEX MICROSCOPIC
BACTERIA UA: NONE SEEN
Bilirubin Urine: NEGATIVE
Hgb urine dipstick: NEGATIVE
KETONES UR: NEGATIVE mg/dL
LEUKOCYTES UA: NEGATIVE
Nitrite: NEGATIVE
Protein, ur: NEGATIVE mg/dL
RBC / HPF: NONE SEEN RBC/hpf (ref 0–5)
Specific Gravity, Urine: 1.027 (ref 1.005–1.030)
pH: 5 (ref 5.0–8.0)

## 2016-06-22 LAB — BASIC METABOLIC PANEL
Anion gap: 8 (ref 5–15)
BUN: 15 mg/dL (ref 6–20)
CALCIUM: 9.2 mg/dL (ref 8.9–10.3)
CHLORIDE: 103 mmol/L (ref 101–111)
CO2: 25 mmol/L (ref 22–32)
CREATININE: 1.18 mg/dL (ref 0.61–1.24)
GFR calc non Af Amer: >60 mL/min (ref >60)
GLUCOSE: 240 mg/dL — AB (ref 65–99)
Potassium: 4.4 mmol/L (ref 3.5–5.1)
Sodium: 136 mmol/L (ref 135–145)

## 2016-06-22 LAB — I-STAT CHEM 8, ED
BUN: 17 mg/dL (ref 6–20)
Calcium, Ion: 1.21 mmol/L (ref 1.15–1.40)
Chloride: 101 mmol/L (ref 101–111)
Creatinine, Ser: 1.2 mg/dL (ref 0.61–1.24)
Glucose, Bld: 239 mg/dL — ABNORMAL HIGH (ref 65–99)
HEMATOCRIT: 42 % (ref 39.0–52.0)
Hemoglobin: 14.3 g/dL (ref 13.0–17.0)
POTASSIUM: 4.4 mmol/L (ref 3.5–5.1)
SODIUM: 138 mmol/L (ref 135–145)
TCO2: 27 mmol/L (ref 0–100)

## 2016-06-22 LAB — CBC
HCT: 42.5 % (ref 39.0–52.0)
Hemoglobin: 15.1 g/dL (ref 13.0–17.0)
MCH: 29.2 pg (ref 26.0–34.0)
MCHC: 35.5 g/dL (ref 30.0–36.0)
MCV: 82 fL (ref 78.0–100.0)
PLATELETS: 223 10*3/uL (ref 150–400)
RBC: 5.18 MIL/uL (ref 4.22–5.81)
RDW: 12.8 % (ref 11.5–15.5)
WBC: 3.9 10*3/uL — ABNORMAL LOW (ref 4.0–10.5)

## 2016-06-22 LAB — PROTIME-INR
INR: 0.97
PROTHROMBIN TIME: 12.9 s (ref 11.4–15.2)

## 2016-06-22 LAB — ETHANOL: Alcohol, Ethyl (B): <5 mg/dL (ref <5)

## 2016-06-22 LAB — I-STAT TROPONIN, ED
TROPONIN I, POC: 0 ng/mL (ref 0.00–0.08)
TROPONIN I, POC: 0 ng/mL (ref 0.00–0.08)

## 2016-06-22 LAB — DIFFERENTIAL
BASOS PCT: 0 %
Basophils Absolute: 0 10*3/uL (ref 0.0–0.1)
Eosinophils Absolute: 0 10*3/uL (ref 0.0–0.7)
Eosinophils Relative: 1 %
Lymphocytes Relative: 32 %
Lymphs Abs: 1.3 10*3/uL (ref 0.7–4.0)
MONO ABS: 0.3 10*3/uL (ref 0.1–1.0)
Monocytes Relative: 6 %
NEUTROS ABS: 2.4 10*3/uL (ref 1.7–7.7)
Neutrophils Relative %: 61 %

## 2016-06-22 LAB — APTT: aPTT: 29 seconds (ref 24–36)

## 2016-06-22 MED ORDER — IOPAMIDOL (ISOVUE-370) INJECTION 76%
100.0000 mL | Freq: Once | INTRAVENOUS | Status: AC | PRN
  Administered 2016-06-22: 100 mL via INTRAVENOUS

## 2016-06-22 MED ORDER — METFORMIN HCL 500 MG PO TABS: 500.0000 mg | ORAL_TABLET | Freq: Two times a day (BID) | ORAL | 2 refills | Status: DC

## 2016-06-22 MED ORDER — ASPIRIN 81 MG PO CHEW
324.0000 mg | CHEWABLE_TABLET | Freq: Once | ORAL | Status: AC
  Administered 2016-06-22: 324 mg via ORAL
  Filled 2016-06-22: qty 4

## 2016-06-22 NOTE — Discharge Instructions (Signed)
Start taking metformin as prescribed. Watch your carb and sugar intake. Follow up with family doctor. Your lab work and scans are normal otherwise today. Return if worsening symptoms.

## 2016-06-22 NOTE — ED Provider Notes (Signed)
[facility]-EMERGENCY DEPT Provider Note   CSN: [id number] Arrival date & time: 06/22/16  1324     History   Chief Complaint Chief Complaint  Patient presents with  . Chest Pain    HPI MARSH HECKLER is a 40 y.o. male.  HPI DAVINCI GLOTFELTY is a 40 y.o. male presents to ED with complaint of chest pain and left arm weakness onset at 5:10 AM. Pt states he was walking to a bus stop when symptoms began. States pain in left side of the chest, describes as pressure like. No nausea, dizziness, SOB. Reports associated left arm weakness, denies numbness. Denies left arm pain. Denies hx of similar symptoms in the past. No treatment prior to coming in. Pt is a non smoker. No medical problems, has been told he has high blood pressure before, but does not have PCP and does not take any medications. Hx of heart problems in father.   Past Medical History:  Diagnosis Date  . Hypertension     There are no active problems to display for this patient.   History reviewed. No pertinent surgical history.     Home Medications    Prior to Admission medications   Medication Sig Start Date End Date Taking? Authorizing Provider  amLODipine (NORVASC) 10 MG tablet Take 1 tablet (10 mg total) by mouth daily. Patient not taking: Reported on 04/04/2015 11/10/14   Francee Piccolo, PA-C  benzonatate (TESSALON) 100 MG capsule Take 1 capsule (100 mg total) by mouth 3 (three) times daily as needed for cough. 08/04/15   Cheri Fowler, PA-C  cetirizine (ZYRTEC) 10 MG tablet Take 1 tablet (10 mg total) by mouth daily. 08/04/15   Cheri Fowler, PA-C  fluticasone (FLONASE) 50 MCG/ACT nasal spray Place 2 sprays into both nostrils daily. 08/04/15   Cheri Fowler, PA-C  lidocaine (LIDODERM) 5 % Place 1 patch onto the skin daily. Remove & Discard patch within 12 hours or as directed by MD 12/19/15   Anselm Pancoast, PA-C  methocarbamol (ROBAXIN) 500 MG tablet Take 1 tablet (500 mg total) by mouth 2 (two) times daily. 12/19/15   Shawn C  Joy, PA-C  naproxen (NAPROSYN) 500 MG tablet Take 1 tablet (500 mg total) by mouth 2 (two) times daily. 08/04/15   Cheri Fowler, PA-C  naproxen (NAPROSYN) 500 MG tablet Take 1 tablet (500 mg total) by mouth 2 (two) times daily. 12/19/15   Shawn C Joy, PA-C  oxyCODONE-acetaminophen (PERCOCET/ROXICET) 5-325 MG tablet Take 1-2 tablets by mouth every 4 (four) hours as needed for severe pain. 12/19/15   Anselm Pancoast, PA-C    Family History History reviewed. No pertinent family history.  Social History Social History  Substance Use Topics  . Smoking status: Never Smoker  . Smokeless tobacco: Never Used  . Alcohol use Yes     Comment: ocasional     Allergies   Patient has no known allergies.   Review of Systems Review of Systems  Constitutional: Negative for chills and fever.  Respiratory: Positive for chest tightness. Negative for cough and shortness of breath.   Cardiovascular: Positive for chest pain. Negative for palpitations and leg swelling.  Gastrointestinal: Negative for abdominal distention, abdominal pain, diarrhea, nausea and vomiting.  Genitourinary: Negative for dysuria, frequency, hematuria and urgency.  Musculoskeletal: Negative for arthralgias, myalgias, neck pain and neck stiffness.  Skin: Negative for rash.  Allergic/Immunologic: Negative for immunocompromised state.  Neurological: Positive for weakness. Negative for dizziness, light-headedness, numbness and headaches.  All other systems  reviewed and are negative.    Physical Exam Updated Vital Signs BP 137/89   Pulse 77   Resp 13   Ht 6' (1.829 m)   Wt 91.6 kg   SpO2 100%   BMI 27.40 kg/m   Physical Exam  Constitutional: He is oriented to person, place, and time. He appears well-developed and well-nourished. No distress.  HENT:  Head: Normocephalic and atraumatic.  Eyes: Conjunctivae are normal.  Neck: Neck supple.  Cardiovascular: Normal rate, regular rhythm and normal heart sounds.   Pulmonary/Chest:  Effort normal. No respiratory distress. He has no wheezes. He has no rales. He exhibits no tenderness.  Abdominal: Soft. Bowel sounds are normal. He exhibits no distension. There is no tenderness. There is no rebound.  Musculoskeletal: He exhibits no edema.  Neurological: He is alert and oriented to person, place, and time. No cranial nerve deficit or sensory deficit. Coordination normal. GCS eye subscore is 4. GCS verbal subscore is 5. GCS motor subscore is 6.  Reflex Scores:      Bicep reflexes are 2+ on the right side and 2+ on the left side.      Patellar reflexes are 2+ on the right side and 2+ on the left side. Left arm, grip weakness. No pronator drift. Normal finger to nose right, takes longer on left side. 5/5 and equal LE strength bilaterally  Skin: Skin is warm and dry.  Nursing note and vitals reviewed.    ED Treatments / Results  Labs (all labs ordered are listed, but only abnormal results are displayed) Labs Reviewed  BASIC METABOLIC PANEL - Abnormal; Notable for the following:       Result Value   Glucose, Bld 240 (*)    All other components within normal limits  CBC - Abnormal; Notable for the following:    WBC 3.9 (*)    All other components within normal limits  URINALYSIS, ROUTINE W REFLEX MICROSCOPIC - Abnormal; Notable for the following:    Color, Urine STRAW (*)    Glucose, UA >=500 (*)    Squamous Epithelial / LPF 0-5 (*)    All other components within normal limits  I-STAT CHEM 8, ED - Abnormal; Notable for the following:    Glucose, Bld 239 (*)    All other components within normal limits  ETHANOL  PROTIME-INR  APTT  RAPID URINE DRUG SCREEN, HOSP PERFORMED  DIFFERENTIAL  I-STAT TROPOININ, ED  I-STAT TROPOININ, ED    EKG  EKG Interpretation  Date/Time:  Friday June 22 2016 06:22:48 EST Ventricular Rate:  78 PR Interval:    QRS Duration: 99 QT Interval:  382 QTC Calculation: 436 R Axis:   54 Text Interpretation:  Sinus rhythm Borderline T  wave abnormalities T wave inverions in inferolateral leads from 2016 have resolved Confirmed by WARD,  DO, KRISTEN ([id number]) on 06/22/2016 6:27:51 AM       Radiology No results found.  Procedures Procedures (including critical care time)  Medications Ordered in ED Medications - No data to display   Initial Impression / Assessment and Plan / ED Course  I have reviewed the triage vital signs and the nursing notes.  Pertinent labs & imaging results that were available during my care of the patient were reviewed by me and considered in my medical decision making (see chart for details).  Clinical Course    6:48 AM Pt seen and examined. Pt with chest pain and left arm weakness onset around 5:10 AM. On exam, left arm  weakness, grip weakness, no drift. Pt reports symptoms improving. Code stroke called after discussion with Dr. Elesa MassedWard who has seen pt as well. Also concern for possible dissection. CT angio ordered.   7:28 AM Seen by Dr. Amada JupiterKirkpatrick, neurology, symptoms now resolved. Will get MR brain.   10:02 AM MRI negative. Delta trop negative. CT angio negative. Pain is atypical. Doubt acs but will need close follow up. Per neurology, no further treatment at this time if MR negative. Pt will need pcp follow up for chest pain recheck and his elevaged glucose. Will start on metformin.   Pt's delta trop is negative. Stable for dc home. Return precautions discussed. Instructed to follow up for diabetes and htn tx.   Vitals:   06/22/16 0945 06/22/16 1015 06/22/16 1031 06/22/16 1032  BP: 121/99 138/83 122/94 122/94  Pulse: 67 71  77  Resp: 15 10  13   Temp:    97.9 F (36.6 C)  SpO2: 100% 100%  100%  Weight:      Height:          Final Clinical Impressions(s) / ED Diagnoses   Final diagnoses:  Chest pain, unspecified type  Arm weakness  Hyperglycemia    New Prescriptions Discharge Medication List as of 06/22/2016 10:10 AM    START taking these medications   Details    metFORMIN (GLUCOPHAGE) 500 MG tablet Take 1 tablet (500 mg total) by mouth 2 (two) times daily with a meal., Starting Fri 06/22/2016, Print         [employer]ana Sherline Eberwein, PA-C 06/22/16 1606

## 2016-06-22 NOTE — ED Notes (Signed)
Patient transported to MRI 

## 2016-06-22 NOTE — Progress Notes (Signed)
Pt from home with new onset Left arm/chest pain and Left arm weakness. Symptoms began as pt was walking to work this am. LKW 0510, NIHSS 0 CBG 239

## 2016-06-22 NOTE — ED Notes (Signed)
Pt returned from CT with this RN.  

## 2016-06-22 NOTE — ED Notes (Signed)
Dr. Kirkpatrick at bedside 

## 2016-06-22 NOTE — ED Notes (Signed)
Pt is aware urine is needed, urinal at bedside.  

## 2016-06-22 NOTE — Consult Note (Signed)
Neurology Consultation Reason for Consult: Left arm heaviness Referring Physician: Ward, K  CC: Left arm heaviness, chest pain  History is obtained from:patient  HPI: Donald Carter is a 40 y.o. male with a history of hypertension who presents with chest pain. Because of the chest pain and chest heaviness, he sought care in the emergency department. While he was being evaluated here, he complained of some left arm heaviness as well. Though he was complaining of weakness, he did not have any drift in that arm. He also denies any numbness during the episode. Since  That began, he has greatly improved and no longer has much in the way of chest pain, shortness of breath, or arm heaviness. He states it feels normal.  LKW: 5:10 AM tpa given?: no, resolution of symptoms    ROS: A 14 point ROS was performed and is negative except as noted in the HPI.   Past Medical History:  Diagnosis Date  . Hypertension      Father: Heart attacks   Social History:  reports that he has never smoked. He has never used smokeless tobacco. He reports that he drinks alcohol. He reports that he does not use drugs.   Exam: Current vital signs: BP 137/89   Pulse 77   Resp 13   Ht 6' (1.829 m)   Wt 91.6 kg (202 lb)   SpO2 100%   BMI 27.40 kg/m  Vital signs in last 24 hours: Pulse Rate:  [77] 77 (12/22 0630) Resp:  [13] 13 (12/22 0630) BP: (137)/(89) 137/89 (12/22 0630) SpO2:  [100 %] 100 % (12/22 0630) Weight:  [91.6 kg (202 lb)] 91.6 kg (202 lb) (12/22 [id number])   Physical Exam  Constitutional: Appears well-developed and well-nourished.  Psych: Affect appropriate to situation Eyes: No scleral injection HENT: No OP obstrucion Head: Normocephalic.  Cardiovascular: Normal rate and regular rhythm.  Respiratory: Effort normal and breath sounds normal to anterior ascultation GI: Soft.  No distension. There is no tenderness.  Skin: WDI  Neuro: Mental Status: Patient is awake, alert, oriented to  person, place, month, year, and situation. Patient is able to give a clear and coherent history. No signs of aphasia or neglect Cranial Nerves: II: Visual Fields are full. Pupils are equal, round, and reactive to light.   III,IV, VI: EOMI without ptosis or diploplia.  V: Facial sensation is symmetric to temperature VII: Facial movement is symmetric.  VIII: hearing is intact to voice X: Uvula elevates symmetrically XI: Shoulder shrug is symmetric. XII: tongue is midline without atrophy or fasciculations.  Motor: Tone is normal. Bulk is normal. 5/5 strength was present in all four extremities.  Sensory: Sensation is symmetric to light touch and pin in the arms and legs. Cerebellar: FNF intact bilaterally   I have reviewed labs in epic and the results pertinent to this consultation are: Elevated Glucose  I have reviewed the images obtained: CT head-negative  Impression: 40 year old male with a history of hypertension, diabetes presents with chest pain, chest heaviness, left arm heaviness. My suspicion is that his sensation of left arm heaviness was more due to his chest presentation is a posterior a separate neurological event. Given his risk factors, it would be reasonable to obtain an MRI, but at this negative then I would not pursue further stroke or neurological workup.  Recommendations: 1) MRI brain 2) if negative, no further neurological workup is needed.   Ritta SlotMcNeill Tameca Jerez, MD [facility] [phone]  If 7pm- 7am, please page neurology on  call as listed in [place].

## 2016-06-22 NOTE — ED Notes (Signed)
Patient transported to X-ray 

## 2016-06-22 NOTE — ED Triage Notes (Signed)
Pt arrives from home with c/o Left sided chest pain/pressure radiating to his neck and left arm. Pt states that the pain started while he was walking to work.

## 2016-06-22 NOTE — ED Provider Notes (Signed)
Medical screening examination/treatment/procedure(s) were conducted as a shared visit with non-physician practitioner(s) and myself.  I personally evaluated the patient during the encounter.   EKG Interpretation  Date/Time:  Friday June 22 2016 06:22:48 EST Ventricular Rate:  78 PR Interval:    QRS Duration: 99 QT Interval:  382 QTC Calculation: 436 R Axis:   54 Text Interpretation:  Sinus rhythm Borderline T wave abnormalities T wave inverions in inferolateral leads from 2016 have resolved Confirmed by Ilynn Stauffer,  DO, Yona Kosek [phone]([id number]) on 06/22/2016 6:27:51 AM      Pt is a 40 y.o. male with history of possible hypertension and diabetes, tobacco use who presents emergency department with left sided chest pressure and left arm weakness that started at 5:10 AM. Will get at 4 AM feeling fine. States no pain in the arm but is unable to move it as well as he would like. No numbness or other weakness. No history of stroke in the past. Denies headache, head injury. On exam patient has 4 minus/5 strength in the left upper extremity but otherwise extremities have normal 5+/5 strength and normal sensation. Cranial nerves II through XII intact. No dysarthria or aphasia. He has 2+ radial pulses bilaterally. Code stroke initiated. NIH stroke scale 1-2.  Head CT shows no acute abnormality. Labs unremarkable other than mild leukopenia, hyperglycemia. Troponin negative. Dr. Amada JupiterKirkpatrick with neurology has seen the patient. We appreciate his help. When neurology evaluated the patient his neurologic deficits completely resolved. Will order an MRI of his brain without contrast. Dr. Amada JupiterKirkpatrick does not think this was a stroke, TIA. If MRI negative, neurology things patient can be discharged home from their perspective. We'll also order CT of the chest, abdomen and pelvis to rule out dissection.   Layla MawKristen N Trevyon Swor, DO 06/22/16 [phone]

## 2017-01-31 IMAGING — CT CT HEAD CODE STROKE
3 of 4 series · 17 of 47 positions shown, 20 images · non-contrast
Comparison: None.

CLINICAL DATA: Code stroke.  Left hand weakness onset 4444 hours

EXAM:
CT HEAD WITHOUT CONTRAST
TECHNIQUE: Contiguous axial images were obtained from the base of the skull
through the vertex without intravenous contrast.

[Series 201: head w/o, idose (1) · axial · non-contrast · 0.49mm/px · z∈[+155,+285]mm · 11 of 32 slices shown, 14 images]
[im 3/32  brain]
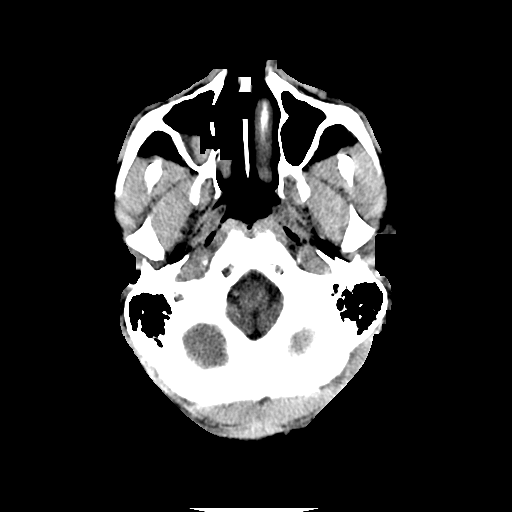
[im 3/32  bone]
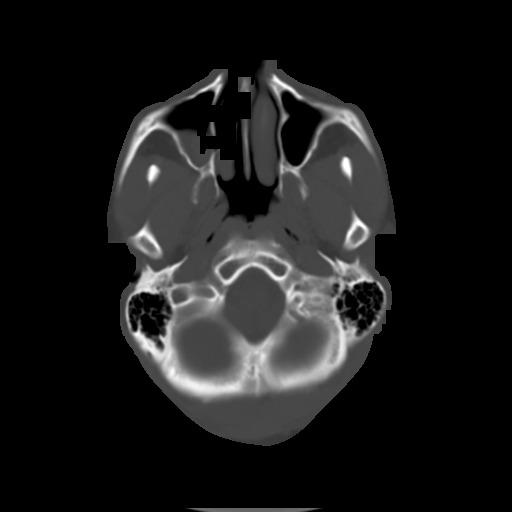
[im 5/32  brain]
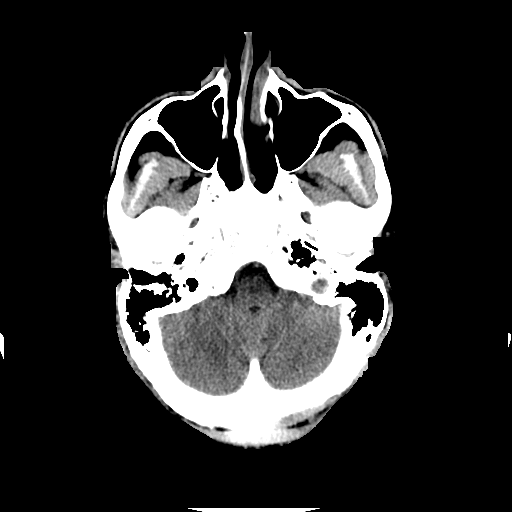
[im 7/32  brain]
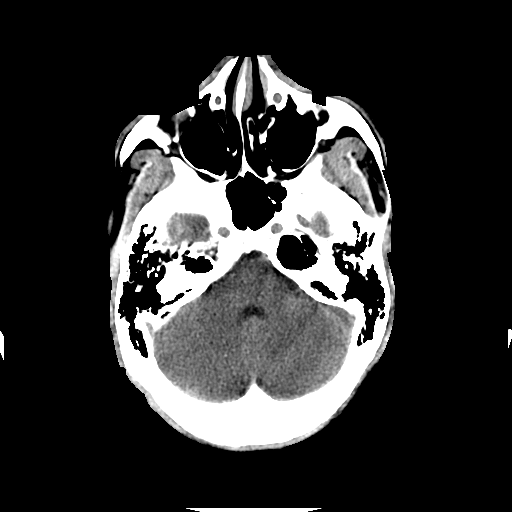
[im 12/32  brain]
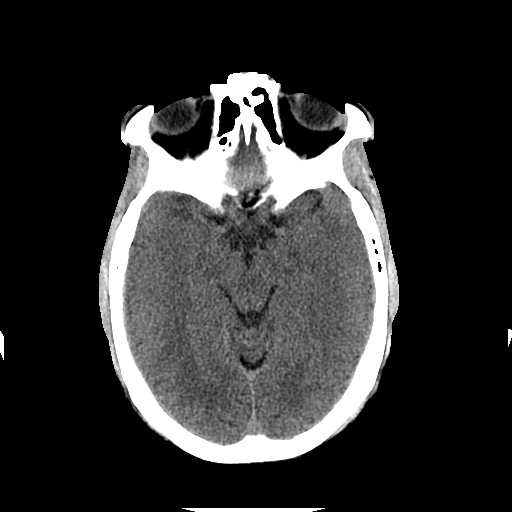
[im 14/32  brain]
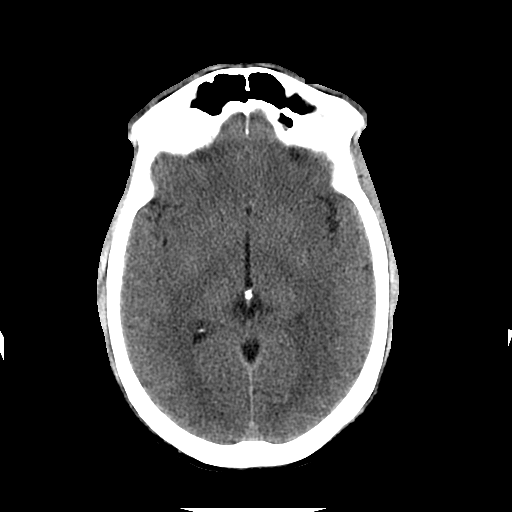
[im 14/32  bone]
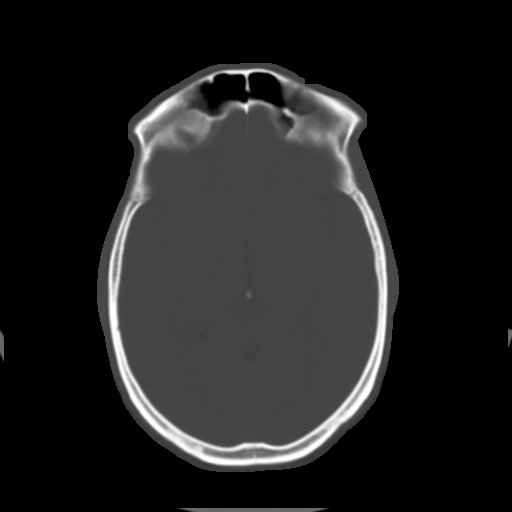
[im 16/32  brain]
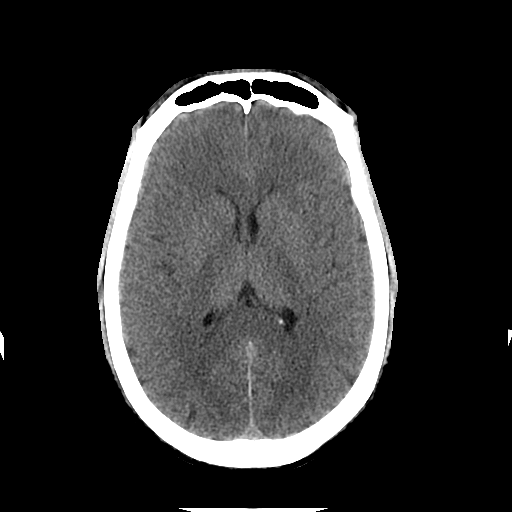
[im 18/32  brain]
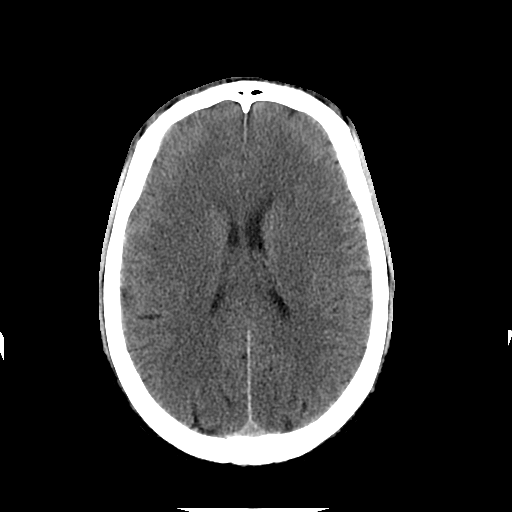
[im 20/32  brain]
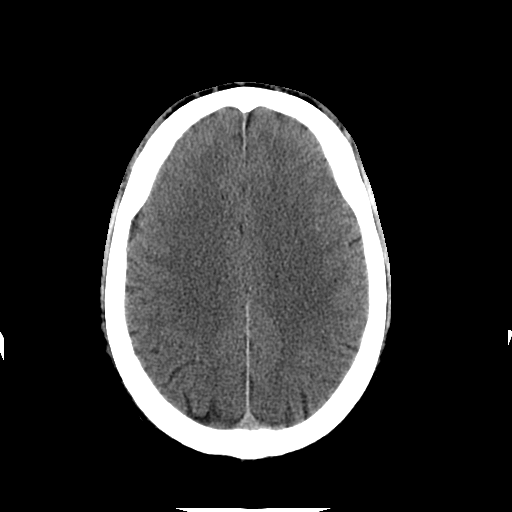
[im 25/32  brain]
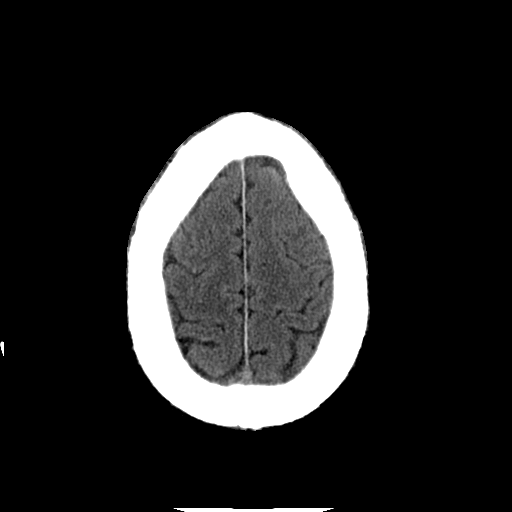
[im 25/32  bone]
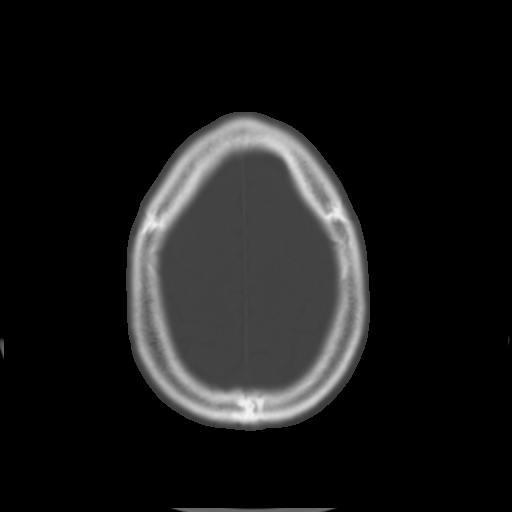
[im 27/32  brain]
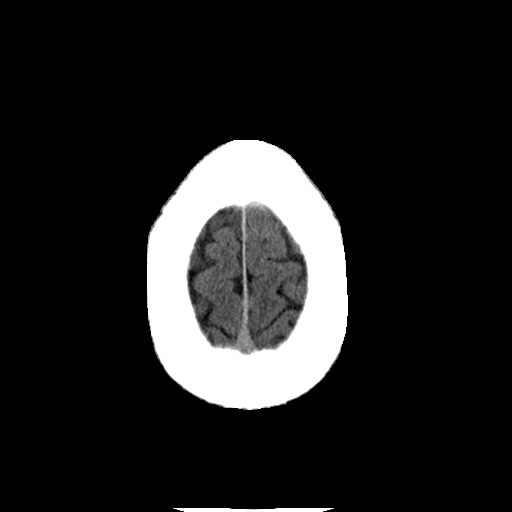
[im 29/32  brain]
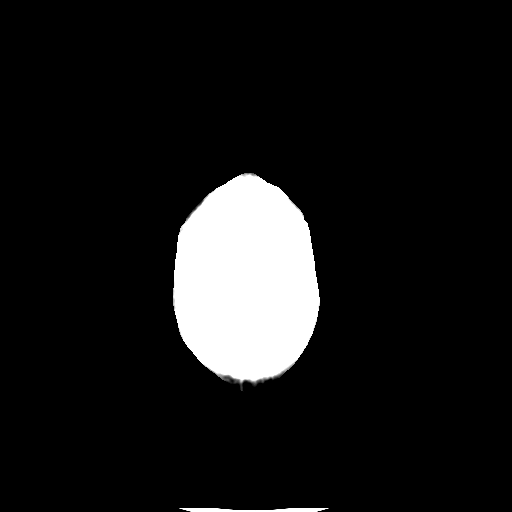

[Series 203: coronal st, idose (1) · coronal · 0.40mm/px · 3 of 82 slices shown]
[im 28/82  brain]
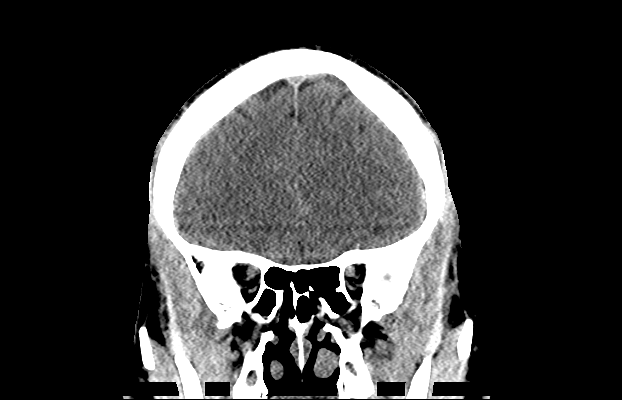
[im 37/82  brain]
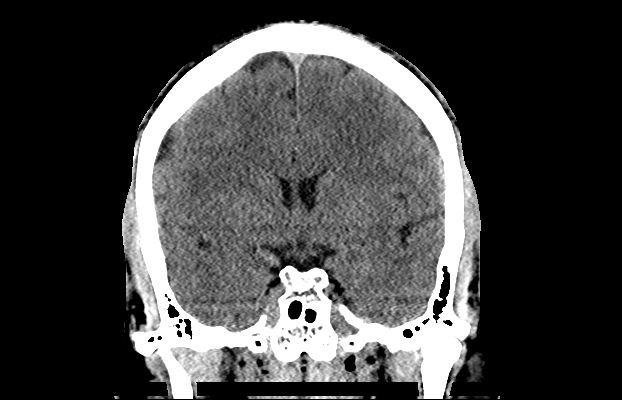
[im 46/82  brain]
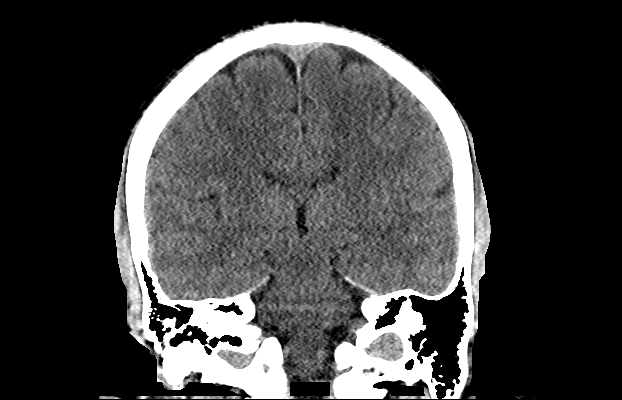

[Series 204: sagittal st, idose (1) · sagittal · 0.40mm/px · 3 of 83 slices shown]
[im 28/83  brain]
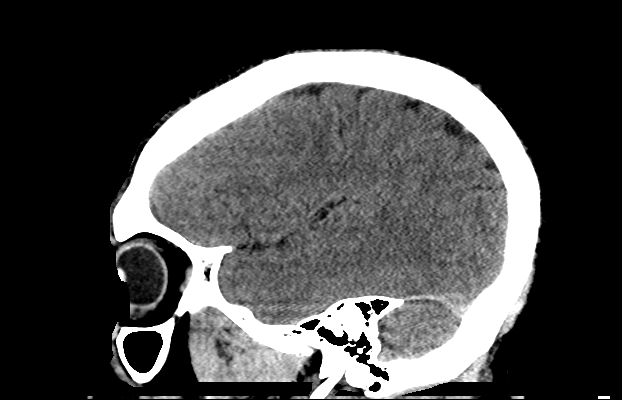
[im 42/83  brain]
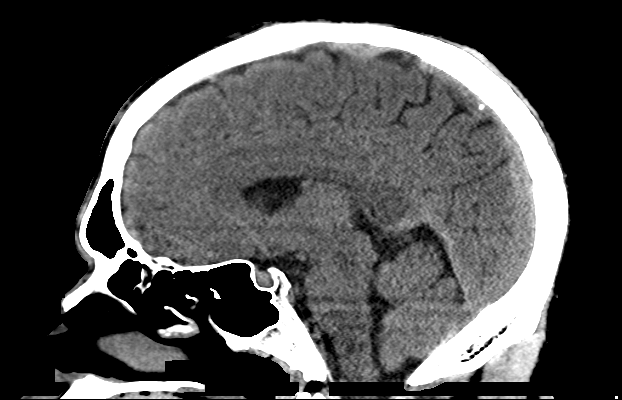
[im 55/83  brain]
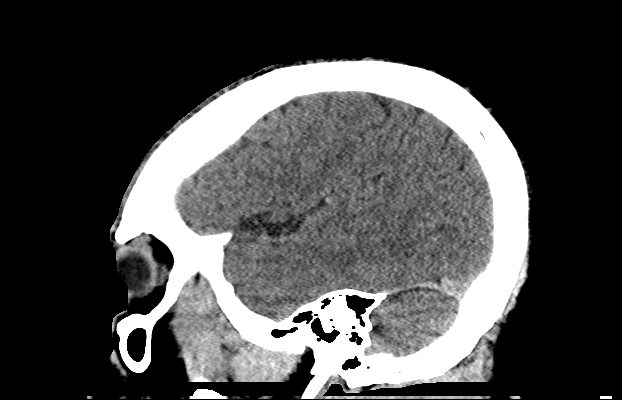

[17 of 47 positions shown; findings below may reference images not displayed]

FINDINGS: Brain: No evidence of acute infarction, hemorrhage, hydrocephalus,
extra-axial collection or mass lesion/mass effect.

Vascular: No hyperdense vessel or unexpected calcification.

Skull: Negative

Sinuses/Orbits: Mucosal edema right maxillary sinus.  Normal orbit.

Other: None

ASPECTS (Alberta Stroke Program Early CT Score)

- Ganglionic level infarction (caudate, lentiform nuclei, internal
capsule, insula, M1-M3 cortex): 7

- Supraganglionic infarction (M4-M6 cortex): 3

Total score (0-10 with 10 being normal): 10
IMPRESSION: 1. Negative CT head
2. ASPECTS is 10

These results were called by telephone at the time of interpretation
on 06/22/2016 at [DATE] to Dr. Ausberto, who verbally
acknowledged these results.

## 2017-02-06 ENCOUNTER — Encounter (HOSPITAL_COMMUNITY): Payer: Self-pay | Admitting: Emergency Medicine

## 2017-02-06 ENCOUNTER — Emergency Department (HOSPITAL_COMMUNITY)
Admission: EM | Admit: 2017-02-06 | Discharge: 2017-02-07 | Disposition: A | Discharge status: Home / Self Care | Payer: Self-pay | Attending: Emergency Medicine | Admitting: Emergency Medicine

## 2017-02-06 DIAGNOSIS — L089 Local infection of the skin and subcutaneous tissue, unspecified: Secondary | ICD-10-CM | POA: Insufficient documentation

## 2017-02-06 DIAGNOSIS — Z79899 Other long term (current) drug therapy: Secondary | ICD-10-CM | POA: Insufficient documentation

## 2017-02-06 DIAGNOSIS — Z23 Encounter for immunization: Secondary | ICD-10-CM | POA: Insufficient documentation

## 2017-02-06 DIAGNOSIS — I10 Essential (primary) hypertension: Secondary | ICD-10-CM | POA: Insufficient documentation

## 2017-02-06 DIAGNOSIS — Z7984 Long term (current) use of oral hypoglycemic drugs: Secondary | ICD-10-CM | POA: Insufficient documentation

## 2017-02-06 LAB — BASIC METABOLIC PANEL
Anion gap: 12 (ref 5–15)
BUN: 6 mg/dL (ref 6–20)
CALCIUM: 9.2 mg/dL (ref 8.9–10.3)
CO2: 24 mmol/L (ref 22–32)
CREATININE: 1.07 mg/dL (ref 0.61–1.24)
Chloride: 99 mmol/L — ABNORMAL LOW (ref 101–111)
GFR calc non Af Amer: >60 mL/min (ref >60)
Glucose, Bld: 340 mg/dL — ABNORMAL HIGH (ref 65–99)
Potassium: 3.9 mmol/L (ref 3.5–5.1)
SODIUM: 135 mmol/L (ref 135–145)

## 2017-02-06 LAB — CBC WITH DIFFERENTIAL/PLATELET
Basophils Absolute: 0 10*3/uL (ref 0.0–0.1)
Basophils Relative: 0 %
EOS ABS: 0 10*3/uL (ref 0.0–0.7)
Eosinophils Relative: 0 %
HCT: 40.6 % (ref 39.0–52.0)
Hemoglobin: 14.1 g/dL (ref 13.0–17.0)
Lymphocytes Relative: 13 %
Lymphs Abs: 1.1 10*3/uL (ref 0.7–4.0)
MCH: 28.3 pg (ref 26.0–34.0)
MCHC: 34.7 g/dL (ref 30.0–36.0)
MCV: 81.4 fL (ref 78.0–100.0)
MONO ABS: 0.2 10*3/uL (ref 0.1–1.0)
MONOS PCT: 3 %
Neutro Abs: 6.8 10*3/uL (ref 1.7–7.7)
Neutrophils Relative %: 84 %
Platelets: 262 10*3/uL (ref 150–400)
RBC: 4.99 MIL/uL (ref 4.22–5.81)
RDW: 13.1 % (ref 11.5–15.5)
WBC: 8.2 10*3/uL (ref 4.0–10.5)

## 2017-02-06 MED ORDER — LIDOCAINE HCL 2 % IJ SOLN: 5.0000 mL | Freq: Once | INTRAMUSCULAR | Status: DC

## 2017-02-06 MED ORDER — TETANUS-DIPHTH-ACELL PERTUSSIS 5-2.5-18.5 LF-MCG/0.5 IM SUSP
0.5000 mL | Freq: Once | INTRAMUSCULAR | Status: AC
  Administered 2017-02-06: 0.5 mL via INTRAMUSCULAR
  Filled 2017-02-06: qty 0.5

## 2017-02-06 MED ORDER — CEPHALEXIN 250 MG PO CAPS
500.0000 mg | ORAL_CAPSULE | Freq: Once | ORAL | Status: AC
  Administered 2017-02-06: 500 mg via ORAL
  Filled 2017-02-06: qty 2

## 2017-02-06 MED ORDER — LIDOCAINE HCL (PF) 1 % IJ SOLN
5.0000 mL | Freq: Once | INTRAMUSCULAR | Status: AC
  Administered 2017-02-06: 5 mL
  Filled 2017-02-06: qty 5

## 2017-02-06 NOTE — ED Notes (Signed)
Patient is A&Ox4.  No signs of distress noted.  Please see providers complete history and physical exam.  

## 2017-02-06 NOTE — ED Triage Notes (Signed)
Pt states for the past 2 weeks he has his index finger swollen, red and painful, pt started bleeding frequently on that finger and he thinks is infected, un sure if bite by an insect. No fever no chills.

## 2017-02-06 NOTE — ED Provider Notes (Signed)
Suffered puncture wound to right index finger distal phalanx palmar aspect approximately 10 days ago. Index fingers painful worse with pressing on the area. No fever. Pain is localized to distal phalanx palmar aspect nonradiating. On exam he has a 2 mm scabbed lesion at the center of the distal cephalic palmar aspect with tiny 2-3 mm surrounding fluctuant area. Good capillary refill. Full range of motion. No redness proximal to distal phalanx   Doug SouJacubowitz, Meghana Tullo, MD 02/06/17 [phone]

## 2017-02-06 NOTE — ED Provider Notes (Signed)
[facility]-EMERG[facility]Y DEPT Provider Note   CSN: [id number] Arrival date & time: 02/06/17  1819  By signing my name below, I, Diona BrownerJennifer Gorman, attest that this documentation has been prepared under the direction and in the presence of Gracelyn Coventry A. Deem Marmol, PA-C. Electronically Signed: Diona BrownerJennifer Gorman, ED Scribe. 02/06/17. 11:07 PM.  History   Chief Complaint Chief Complaint  Patient presents with  . right index infection    HPI Donald Carter is a 41 y.o. male with a PMHx of HTN and borderline DM, who presents to the Emergency Department complaining of right index finger swelling that started ~ month ago. About 2 weeks ago he pricked his finger with metal at work. 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 Pt reports the wound on his finger will stop bleeding when there is pressure applied, and will continue to bleed when pressure is removed. Pt endorses mild pain, drainage, and color change to his finger that started yesterday. He thinks his finger is infected. He notes 2 days ago hitting his finger and when he squeezed it to stop the pain, pus came out. Tetanus is NOT UTD. Pt denies fever, chills, HA, urinary sx, or any other complaints at this time.   The history is provided by the patient. No language interpreter was used.    Past Medical History:  Diagnosis Date  . Hypertension     There are no active problems to display for this patient.   History reviewed. No pertinent surgical history.     Home Medications    Prior to Admission medications   Medication Sig Start Date End Date Taking? Authorizing Provider  amLODipine (NORVASC) 10 MG tablet Take 1 tablet (10 mg total) by mouth daily. 02/07/17   Hazem Kenner A, PA-C  benzonatate (TESSALON) 100 MG capsule Take 1 capsule (100 mg total) by mouth 3 (three) times daily as needed for cough. Patient not taking: Reported on 06/22/2016 08/04/15   Cheri Fowlerose, Kayla, PA-C  cephALEXin (KEFLEX) 500 MG capsule Take 1 capsule (500 mg total) by mouth 4 (four) times daily.  02/07/17   Bernard Donahoo A, PA-C  cetirizine (ZYRTEC) 10 MG tablet Take 1 tablet (10 mg total) by mouth daily. Patient not taking: Reported on 06/22/2016 08/04/15   Cheri Fowlerose, Kayla, PA-C  fluticasone [facility](FLONASE) 50 MCG/ACT nasal spray Place 2 sprays into both nostrils daily. Patient not taking: Reported on 06/22/2016 08/04/15   Cheri Fowlerose, Kayla, PA-C  ibuprofen (ADVIL,MOTRIN) 200 MG tablet Take 200 mg by mouth every 6 (six) hours as needed for moderate pain.    [provider]  lidocaine (LIDODERM) 5 % Place 1 patch onto the skin daily. Remove & Discard patch within 12 hours or as directed by MD Patient not taking: Reported on 06/22/2016 12/19/15   Anselm PancoastJoy, Shawn C, PA-C  metFORMIN (GLUCOPHAGE) 500 MG tablet Take 1 tablet (500 mg total) by mouth 2 (two) times daily with a meal. 06/22/16   Kirichenko, Tatyana, PA-C  methocarbamol (ROBAXIN) 500 MG tablet Take 1 tablet (500 mg total) by mouth 2 (two) times daily. Patient not taking: Reported on 06/22/2016 12/19/15   Joy, Ines BloomerShawn C, PA-C  naproxen (NAPROSYN) 500 MG tablet Take 1 tablet (500 mg total) by mouth 2 (two) times daily. Patient not taking: Reported on 06/22/2016 08/04/15   Cheri Fowlerose, Kayla, PA-C  naproxen (NAPROSYN) 500 MG tablet Take 1 tablet (500 mg total) by mouth 2 (two) times daily. Patient not taking: Reported on 06/22/2016 12/19/15   Anselm PancoastJoy, Shawn C, PA-C  oxyCODONE-acetaminophen (PERCOCET/ROXICET) 5-325 MG tablet  Take 1-2 tablets by mouth every 4 (four) hours as needed for severe pain. Patient not taking: Reported on 06/22/2016 12/19/15   Anselm Pancoast, PA-C    Family History No family history on file.  Social History Social History  Substance Use Topics  . Smoking status: Never Smoker  . Smokeless tobacco: Never Used  . Alcohol use Yes     Comment: ocasional     Allergies   Patient has no known allergies.   Review of Systems Review of Systems  Constitutional: Negative for activity change, chills and fever.  Respiratory: Negative for  shortness of breath.   Cardiovascular: Negative for chest pain.  Gastrointestinal: Negative for abdominal pain.  Genitourinary: Negative for difficulty urinating, dysuria, frequency, hematuria and urgency.  Musculoskeletal: Positive for arthralgias and joint swelling. Negative for back pain.  Skin: Positive for color change and wound. Negative for rash.  Neurological: Negative for headaches.     Physical Exam Updated Vital Signs BP (!) 152/92 (BP Location: Right Arm)   Pulse 71   Temp 98 F (36.7 C) (Oral)   Resp 16   Ht 6' (1.829 m)   Wt 93 kg (205 lb)   SpO2 98%   BMI 27.80 kg/m   Physical Exam  Constitutional: He appears well-developed.  HENT:  Head: Normocephalic.  Eyes: Conjunctivae are normal.  Neck: Neck supple.  Cardiovascular: Normal rate and regular rhythm.   No murmur heard. Pulmonary/Chest: Effort normal.  Abdominal: Soft. He exhibits no distension.  Neurological: He is alert.  Skin: Skin is warm and dry.  There is a puncture wound with minimal surrounding fluctuance, measuring with a radius of approximately 0.25, cm to the palmar surface of the right index finger, distal to the DIP joint. No surrounding erythema, warmth, edema, or ecchymosis. No active drainage or drainage expressed on exam. Good strength of the digit against resistance. Sensation is intact. Radial pulses 2+ bilaterally.   Psychiatric: His behavior is normal.  Nursing note and vitals reviewed.      ED Treatments / Results  DIAGNOSTIC STUDIES: Oxygen Saturation is 100% on RA, normal by my interpretation.   COORDINATION OF CARE: 11:07 PM-Discussed next steps with pt which includes updating his tetanus. Pt verbalized understanding and is agreeable with the plan.   Labs (all labs ordered are listed, but only abnormal results are displayed) Labs Reviewed  BASIC METABOLIC PANEL - Abnormal; Notable for the following:       Result Value   Chloride 99 (*)    Glucose, Bld 340 (*)    All  other components within normal limits  CBC WITH DIFFERENTIAL/PLATELET    EKG  EKG Interpretation None       Radiology No results found.  Procedures .Marland KitchenIncision and Drainage Date/Time: 02/07/2017 3:40 AM Performed by: Lilian Kapur, Chanelle Hodsdon A Authorized by: Lilian Kapur, Makenly Larabee A   Consent:    Consent obtained:  Verbal   Consent given by:  Patient   Risks discussed:  Bleeding, incomplete drainage, pain, infection and damage to other organs   Alternatives discussed:  No treatment and referral Location:    Indications for incision and drainage: wound infection.   Size:  0.25 cm    Location: right index finger  Pre-procedure details:    Skin preparation:  Betadine Anesthesia (see MAR for exact dosages):    Anesthesia method:  Local infiltration   Local anesthetic:  Lidocaine 1% w/o epi Procedure type:    Complexity:  Simple Procedure details:    Incision types:  Stab incision   Incision depth:  Dermal   Scalpel blade:  11   Drainage:  Purulent and bloody   Drainage amount:  Scant   Wound treatment:  Wound left open Post-procedure details:    Patient tolerance of procedure:  Tolerated well, no immediate complications    (including critical care time)  Medications Ordered in ED Medications  Tdap (BOOSTRIX) injection 0.5 mL (0.5 mLs Intramuscular Given 02/06/17 2339)  cephALEXin (KEFLEX) capsule 500 mg (500 mg Oral Given 02/06/17 2338)  lidocaine (PF) (XYLOCAINE) 1 % injection 5 mL (5 mLs Infiltration Given 02/06/17 2339)     Initial Impression / Assessment and Plan / ED Course  I have reviewed the triage vital signs and the nursing notes.  Pertinent labs & imaging results that were available during my care of the patient were reviewed by me and considered in my medical decision making (see chart for details).  Clinical Course as of Feb 07 353  Thu Feb 07, 2017  0005 Pt will be given a prescription for keflex and amlodipine. He is to follow up with a hand doctor within the next few  days.   [JG]    Clinical Course User Index [JG] Diona Browner    41 year old patient with a puncture wound to the right index finger. Afebrile. No tachycardia. No constitutional symptoms. No leukocytosis. CBG 340 today; however his bicarb was 24 and anion gap was 12. No focal deficits on neuro exam. Patient's BP was initially 172/106, improving to 152/92. He reports he was previously taking amlodipine for HTN and was a pre-diabetic, but hasn't been seen by a medical practitioner in some time and was out of refills for his medical. No complaints of HA, CP, or difficulty voiding at this time. Will provide the patient with a refill of amlodipine, a referral to [facility] to have his blood pressure and CBG rechecked within the next week.   Discussed and evaluated the patient with Dr. Ethelda Chick, attending physician. Tdap updated. An I&D with a stab incision, and scant purulent drainage was expressed. The wound was cleaned and topical antibiotics were applied. The patient received his first dose of Keflex in the ED and will be d/ced with a 7-day course. Will provide a referral to hand surgery for follow-up. Strict return precautions given. VS improving. NAD. The patient is safe for d/c at this time.   Final Clinical Impressions(s) / ED Diagnoses   Final diagnoses:  Finger infection    New Prescriptions Discharge Medication List as of 02/07/2017 12:17 AM    START taking these medications   Details  cephALEXin (KEFLEX) 500 MG capsule Take 1 capsule (500 mg total) by mouth 4 (four) times daily., Starting Thu 02/07/2017, Print       I personally performed the services described in this documentation, which was scribed in my presence. The recorded information has been reviewed and is accurate.     Frederik Pear A, PA-C 02/07/17 0354    Doug Sou, MD 02/07/17 [phone]

## 2017-02-06 NOTE — ED Notes (Signed)
ED Provider at bedside. 

## 2017-02-07 MED ORDER — CEPHALEXIN 500 MG PO CAPS: 500.0000 mg | ORAL_CAPSULE | Freq: Four times a day (QID) | ORAL | 0 refills | Status: DC

## 2017-02-07 MED ORDER — AMLODIPINE BESYLATE 10 MG PO TABS: 10.0000 mg | ORAL_TABLET | Freq: Every day | ORAL | 3 refills | Status: DC

## 2017-02-07 NOTE — Discharge Instructions (Signed)
Please call Dr. Debby Budoley's office to schedule a follow up appointment.  You can call [facility] to get established with a primary care provider. Please try to have your blood pressure and blood sugar rechecked within the next week.  We performed an incision and drainage of your finger today. Your tetanus was updated and your first dose of Keflex was given in the Emergency Department. Please take Keflex every 6 hours (4 times per day) for the next week. If you develop new or worsening symptoms including fever, chills, or if the finger becomes red, hot, and swollen, please return to the Emergency Department for re-evaluation.

## 2017-02-15 ENCOUNTER — Encounter (HOSPITAL_COMMUNITY): Payer: Self-pay | Admitting: *Deleted

## 2017-02-15 ENCOUNTER — Emergency Department (HOSPITAL_COMMUNITY)
Admission: EM | Admit: 2017-02-15 | Discharge: 2017-02-15 | Disposition: A | Discharge status: Home / Self Care | Payer: Self-pay | Attending: Emergency Medicine | Admitting: Emergency Medicine

## 2017-02-15 ENCOUNTER — Emergency Department (HOSPITAL_COMMUNITY): Payer: Self-pay

## 2017-02-15 DIAGNOSIS — Y33XXXD Other specified events, undetermined intent, subsequent encounter: Secondary | ICD-10-CM | POA: Insufficient documentation

## 2017-02-15 DIAGNOSIS — Y939 Activity, unspecified: Secondary | ICD-10-CM | POA: Insufficient documentation

## 2017-02-15 DIAGNOSIS — S61209A Unspecified open wound of unspecified finger without damage to nail, initial encounter: Secondary | ICD-10-CM | POA: Insufficient documentation

## 2017-02-15 DIAGNOSIS — Y999 Unspecified external cause status: Secondary | ICD-10-CM | POA: Insufficient documentation

## 2017-02-15 DIAGNOSIS — I10 Essential (primary) hypertension: Secondary | ICD-10-CM | POA: Insufficient documentation

## 2017-02-15 DIAGNOSIS — Y929 Unspecified place or not applicable: Secondary | ICD-10-CM | POA: Insufficient documentation

## 2017-02-15 DIAGNOSIS — Z79899 Other long term (current) drug therapy: Secondary | ICD-10-CM | POA: Insufficient documentation

## 2017-02-15 MED ORDER — MUPIROCIN CALCIUM 2 % EX CREA: 1.0000 "application " | TOPICAL_CREAM | Freq: Two times a day (BID) | CUTANEOUS | 0 refills | Status: DC

## 2017-02-15 NOTE — ED Triage Notes (Signed)
Pt c/o R index finger pain, pt reports onset of wound to R index finger x 3 wks, pt seen here last week for I&D, pt here today for worsening pain, pt reports compliance with antibiotics, denies drainage from the area, denies fever & chills, pt has swelling & redness to the area, A&O x4

## 2017-02-15 NOTE — ED Notes (Signed)
PT states understanding of care given, follow up care, and medication prescribed. PT ambulated from ED to car with a steady gait. 

## 2017-02-15 NOTE — ED Provider Notes (Signed)
[facility] DEPT Provider Note   CSN: [id number] Arrival date & time: 02/15/17  1611  By signing my name below, I, Donald Carter, attest that this documentation has been prepared under the direction and in the presence of Leslie K. Sofia, PA-C. Electronically Signed: Deland Carter, ED Scribe. 02/15/17. 5:51 PM.  History   Chief Complaint Chief Complaint  Patient presents with  . Hand Pain   The history is provided by the patient. No language interpreter was used.    HPI Comments: Donald Carter is a 41 y.o. male who presents to the Emergency Department complaining of gradually worsening, peristent right index finger pain due to a wound that began 3 weeks ago. He states that he was seen on 02/06/2017 for the same, where the area was incised and drained. He reports some purulent discharge upon drainage. The pt states that he tried to get in contact with with hand surgery (Dr. Izora Ribas) 2X but was unable to schedule an appointmet. 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 He denies fever and redness.  Past Medical History:  Diagnosis Date  . Hypertension     There are no active problems to display for this patient.   History reviewed. No pertinent surgical history.     Home Medications    Prior to Admission medications   Medication Sig Start Date End Date Taking? Authorizing Provider  amLODipine (NORVASC) 10 MG tablet Take 1 tablet (10 mg total) by mouth daily. 02/07/17   McDonald, Mia A, PA-C  benzonatate (TESSALON) 100 MG capsule Take 1 capsule (100 mg total) by mouth 3 (three) times daily as needed for cough. Patient not taking: Reported on 06/22/2016 08/04/15   Cheri Fowler, PA-C  cephALEXin (KEFLEX) 500 MG capsule Take 1 capsule (500 mg total) by mouth 4 (four) times daily. 02/07/17   McDonald, Mia A, PA-C  cetirizine (ZYRTEC) 10 MG tablet Take 1 tablet (10 mg total) by mouth daily. Patient not taking: Reported  on 06/22/2016 08/04/15   Cheri Fowler, PA-C  fluticasone [facility]) 50 MCG/ACT nasal spray Place 2 sprays into both nostrils daily. Patient not taking: Reported on 06/22/2016 08/04/15   Cheri Fowler, PA-C  ibuprofen (ADVIL,MOTRIN) 200 MG tablet Take 200 mg by mouth every 6 (six) hours as needed for moderate pain.    [provider]  lidocaine (LIDODERM) 5 % Place 1 patch onto the skin daily. Remove & Discard patch within 12 hours or as directed by MD Patient not taking: Reported on 06/22/2016 12/19/15   Anselm Pancoast, PA-C  metFORMIN (GLUCOPHAGE) 500 MG tablet Take 1 tablet (500 mg total) by mouth 2 (two) times daily with a meal. 06/22/16   Kirichenko, Tatyana, PA-C  methocarbamol (ROBAXIN) 500 MG tablet Take 1 tablet (500 mg total) by mouth 2 (two) times daily. Patient not taking: Reported on 06/22/2016 12/19/15   Joy, Ines Bloomer C, PA-C  naproxen (NAPROSYN) 500 MG tablet Take 1 tablet (500 mg total) by mouth 2 (two) times daily. Patient not taking: Reported on 06/22/2016 08/04/15   Cheri Fowler, PA-C  naproxen (NAPROSYN) 500 MG tablet Take 1 tablet (500 mg total) by mouth 2 (two) times daily. Patient not taking: Reported on 06/22/2016 12/19/15   Anselm Pancoast, PA-C  oxyCODONE-acetaminophen (PERCOCET/ROXICET) 5-325 MG tablet Take 1-2 tablets by mouth every 4 (four) hours as needed for severe pain. Patient not taking: Reported on 06/22/2016 12/19/15   Anselm Pancoast, PA-C  Family History No family history on file.  Social History Social History  Substance Use Topics  . Smoking status: Never Smoker  . Smokeless tobacco: Never Used  . Alcohol use Yes     Comment: ocasional     Allergies   Patient has no known allergies.   Review of Systems Review of Systems  Constitutional: Negative for fever.  Skin: Positive for wound. Negative for color change.     Physical Exam Updated Vital Signs BP (!) 142/91 (BP Location: Left Arm)   Pulse 90   Temp 98.5 F (36.9 C) (Oral)   Resp 16   Ht 6'  (1.829 m)   Wt 207 lb (93.9 kg)   SpO2 100%   BMI 28.07 kg/m   Physical Exam  Constitutional: He is oriented to person, place, and time. He appears well-developed and well-nourished.  HENT:  Head: Normocephalic.  Eyes: EOM are normal.  Neck: Normal range of motion.  Pulmonary/Chest: Effort normal.  Abdominal: He exhibits no distension.  Musculoskeletal: Normal range of motion.  Neurological: He is alert and oriented to person, place, and time.  Skin:  A raised 3 mm open wound to his right index finger.   Psychiatric: He has a normal mood and affect.  Nursing note and vitals reviewed.    ED Treatments / Results   DIAGNOSTIC STUDIES: Oxygen Saturation is 100% on RA, normal by my interpretation.   COORDINATION OF CARE: 5:39 PM-Discussed next steps with pt. Pt verbalized understanding and is agreeable with the plan.   Labs (all labs ordered are listed, but only abnormal results are displayed) Labs Reviewed - No data to display  EKG  EKG Interpretation None       Radiology No results found.  Procedures Procedures (including critical care time)  Medications Ordered in ED Medications - No data to display   Initial Impression / Assessment and Plan / ED Course  I have reviewed the triage vital signs and the nursing notes.  Pertinent labs & imaging results that were available during my care of the patient were reviewed by me and considered in my medical decision making (see chart for details).     No foreign body,  I will try bactroban ointment.  Pt advised to follow up with Hand.   Final Clinical Impressions(s) / ED Diagnoses   Final diagnoses:  Open wound of finger, initial encounter    New Prescriptions Discharge Medication List as of 02/15/2017  6:10 PM    START taking these medications   Details  mupirocin cream (BACTROBAN) 2 % Apply 1 application topically 2 (two) times daily., Starting Fri 02/15/2017, Print        I personally performed the  services in this documentation, which was scribed in my presence.  The recorded information has been reviewed and considered.   Barnet Pall. An After Visit Summary was printed and given to the patient.    Elson Areas, PA-C 02/15/17 2019    Geoffery Lyons, MD 02/15/17 [phone]

## 2017-02-19 ENCOUNTER — Other Ambulatory Visit: Payer: Self-pay | Admitting: Orthopedic Surgery

## 2017-02-20 ENCOUNTER — Encounter (HOSPITAL_BASED_OUTPATIENT_CLINIC_OR_DEPARTMENT_OTHER): Payer: Self-pay | Admitting: *Deleted

## 2017-02-21 ENCOUNTER — Encounter (HOSPITAL_BASED_OUTPATIENT_CLINIC_OR_DEPARTMENT_OTHER)
Admission: RE | Disposition: A | Discharge status: Home / Self Care | Payer: Self-pay | Source: Ambulatory Visit | Attending: Orthopedic Surgery

## 2017-02-21 ENCOUNTER — Ambulatory Visit (HOSPITAL_BASED_OUTPATIENT_CLINIC_OR_DEPARTMENT_OTHER)
Admission: RE | Admit: 2017-02-21 | Discharge: 2017-02-21 | Disposition: A | Discharge status: Home / Self Care | Payer: Self-pay | Source: Ambulatory Visit | Attending: Orthopedic Surgery | Admitting: Orthopedic Surgery

## 2017-02-21 ENCOUNTER — Encounter (HOSPITAL_BASED_OUTPATIENT_CLINIC_OR_DEPARTMENT_OTHER): Payer: Self-pay

## 2017-02-21 ENCOUNTER — Ambulatory Visit (HOSPITAL_BASED_OUTPATIENT_CLINIC_OR_DEPARTMENT_OTHER): Payer: Self-pay | Admitting: Anesthesiology

## 2017-02-21 DIAGNOSIS — L980 Pyogenic granuloma: Secondary | ICD-10-CM | POA: Insufficient documentation

## 2017-02-21 DIAGNOSIS — Z79899 Other long term (current) drug therapy: Secondary | ICD-10-CM | POA: Insufficient documentation

## 2017-02-21 DIAGNOSIS — I10 Essential (primary) hypertension: Secondary | ICD-10-CM | POA: Insufficient documentation

## 2017-02-21 HISTORY — PX: INCISION AND DRAINAGE: SHX5863

## 2017-02-21 SURGERY — INCISION AND DRAINAGE
Anesthesia: Monitor Anesthesia Care | Site: Finger | Laterality: Right

## 2017-02-21 MED ORDER — DEXAMETHASONE SODIUM PHOSPHATE 10 MG/ML IJ SOLN
INTRAMUSCULAR | Status: DC | PRN
  Administered 2017-02-21: 10 mg via INTRAVENOUS

## 2017-02-21 MED ORDER — FENTANYL CITRATE (PF) 100 MCG/2ML IJ SOLN
INTRAMUSCULAR | Status: AC
  Filled 2017-02-21: qty 2

## 2017-02-21 MED ORDER — FENTANYL CITRATE (PF) 100 MCG/2ML IJ SOLN
50.0000 ug | INTRAMUSCULAR | Status: DC | PRN
  Administered 2017-02-21: 100 ug via INTRAVENOUS

## 2017-02-21 MED ORDER — LACTATED RINGERS IV SOLN
INTRAVENOUS | Status: DC
  Administered 2017-02-21: 08:00:00 via INTRAVENOUS

## 2017-02-21 MED ORDER — HYDROCODONE-ACETAMINOPHEN 5-325 MG PO TABS: ORAL_TABLET | ORAL | 0 refills | Status: DC

## 2017-02-21 MED ORDER — BUPIVACAINE HCL (PF) 0.5 % IJ SOLN
INTRAMUSCULAR | Status: AC
  Filled 2017-02-21: qty 30

## 2017-02-21 MED ORDER — BUPIVACAINE HCL (PF) 0.5 % IJ SOLN
INTRAMUSCULAR | Status: DC | PRN
  Administered 2017-02-21: 7 mL

## 2017-02-21 MED ORDER — PROPOFOL 500 MG/50ML IV EMUL
INTRAVENOUS | Status: DC | PRN
  Administered 2017-02-21: 25 ug/kg/min via INTRAVENOUS

## 2017-02-21 MED ORDER — CHLORHEXIDINE GLUCONATE 4 % EX LIQD: 60.0000 mL | Freq: Once | CUTANEOUS | Status: DC

## 2017-02-21 MED ORDER — CEFAZOLIN SODIUM-DEXTROSE 2-3 GM-% IV SOLR
INTRAVENOUS | Status: DC | PRN
  Administered 2017-02-21: 2 g via INTRAVENOUS

## 2017-02-21 MED ORDER — LIDOCAINE 2% (20 MG/ML) 5 ML SYRINGE
INTRAMUSCULAR | Status: AC
  Filled 2017-02-21: qty 5

## 2017-02-21 MED ORDER — SCOPOLAMINE 1 MG/3DAYS TD PT72: 1.0000 | MEDICATED_PATCH | Freq: Once | TRANSDERMAL | Status: DC | PRN

## 2017-02-21 MED ORDER — LIDOCAINE HCL (CARDIAC) 20 MG/ML IV SOLN
INTRAVENOUS | Status: DC | PRN
  Administered 2017-02-21: 50 mg via INTRAVENOUS

## 2017-02-21 MED ORDER — MIDAZOLAM HCL 2 MG/2ML IJ SOLN
INTRAMUSCULAR | Status: AC
  Filled 2017-02-21: qty 2

## 2017-02-21 MED ORDER — PROPOFOL 500 MG/50ML IV EMUL
INTRAVENOUS | Status: AC
  Filled 2017-02-21: qty 50

## 2017-02-21 MED ORDER — DEXAMETHASONE SODIUM PHOSPHATE 10 MG/ML IJ SOLN
INTRAMUSCULAR | Status: AC
  Filled 2017-02-21: qty 1

## 2017-02-21 MED ORDER — MIDAZOLAM HCL 2 MG/2ML IJ SOLN
1.0000 mg | INTRAMUSCULAR | Status: DC | PRN
  Administered 2017-02-21: 2 mg via INTRAVENOUS

## 2017-02-21 MED ORDER — SULFAMETHOXAZOLE-TRIMETHOPRIM 800-160 MG PO TABS: 1.0000 | ORAL_TABLET | Freq: Two times a day (BID) | ORAL | 0 refills | Status: DC

## 2017-02-21 MED ORDER — ONDANSETRON HCL 4 MG/2ML IJ SOLN
INTRAMUSCULAR | Status: DC | PRN
  Administered 2017-02-21: 4 mg via INTRAVENOUS

## 2017-02-21 MED ORDER — ONDANSETRON HCL 4 MG/2ML IJ SOLN
INTRAMUSCULAR | Status: AC
  Filled 2017-02-21: qty 2

## 2017-02-21 MED ORDER — CEFAZOLIN SODIUM-DEXTROSE 2-4 GM/100ML-% IV SOLN
INTRAVENOUS | Status: AC
Start: 2017-02-21 — End: ?
  Filled 2017-02-21: qty 100

## 2017-02-21 SURGICAL SUPPLY — 52 items
BAG DECANTER FOR FLEXI CONT (MISCELLANEOUS) IMPLANT
BANDAGE ACE 3X5.8 VEL STRL LF (GAUZE/BANDAGES/DRESSINGS) IMPLANT
BLADE MINI RND TIP GREEN BEAV (BLADE) IMPLANT
BLADE SURG 15 STRL LF DISP TIS (BLADE) ×2 IMPLANT
BLADE SURG 15 STRL SS (BLADE) ×6
BNDG CMPR 9X4 STRL LF SNTH (GAUZE/BANDAGES/DRESSINGS)
BNDG COHESIVE 1X5 TAN STRL LF (GAUZE/BANDAGES/DRESSINGS) ×3 IMPLANT
BNDG ELASTIC 2X5.8 VLCR STR LF (GAUZE/BANDAGES/DRESSINGS) IMPLANT
BNDG ESMARK 4X9 LF (GAUZE/BANDAGES/DRESSINGS) IMPLANT
BNDG GAUZE 1X2.1 STRL (MISCELLANEOUS) IMPLANT
BNDG GAUZE ELAST 4 BULKY (GAUZE/BANDAGES/DRESSINGS) IMPLANT
CHLORAPREP W/TINT 26ML (MISCELLANEOUS) ×3 IMPLANT
CORD BIPOLAR FORCEPS 12FT (ELECTRODE) ×3 IMPLANT
COVER BACK TABLE 60X90IN (DRAPES) ×3 IMPLANT
COVER MAYO STAND STRL (DRAPES) ×3 IMPLANT
CUFF TOURNIQUET SINGLE 18IN (TOURNIQUET CUFF) ×3 IMPLANT
DRAPE EXTREMITY T 121X128X90 (DRAPE) ×3 IMPLANT
DRAPE SURG 17X23 STRL (DRAPES) ×3 IMPLANT
GAUZE PACKING IODOFORM 1/4X15 (GAUZE/BANDAGES/DRESSINGS) IMPLANT
GAUZE SPONGE 4X4 12PLY STRL (GAUZE/BANDAGES/DRESSINGS) ×3 IMPLANT
GAUZE XEROFORM 1X8 LF (GAUZE/BANDAGES/DRESSINGS) ×3 IMPLANT
GLOVE BIO SURGEON STRL SZ7.5 (GLOVE) ×3 IMPLANT
GLOVE BIOGEL PI IND STRL 7.0 (GLOVE) ×1 IMPLANT
GLOVE BIOGEL PI IND STRL 8 (GLOVE) ×1 IMPLANT
GLOVE BIOGEL PI INDICATOR 7.0 (GLOVE) ×2
GLOVE BIOGEL PI INDICATOR 8 (GLOVE) ×2
GLOVE ECLIPSE 6.5 STRL STRAW (GLOVE) ×3 IMPLANT
GLOVE EXAM NITRILE MD LF STRL (GLOVE) ×3 IMPLANT
GOWN STRL REUS W/ TWL LRG LVL3 (GOWN DISPOSABLE) ×1 IMPLANT
GOWN STRL REUS W/TWL LRG LVL3 (GOWN DISPOSABLE) ×3
GOWN STRL REUS W/TWL XL LVL3 (GOWN DISPOSABLE) ×3 IMPLANT
LOOP VESSEL MAXI BLUE (MISCELLANEOUS) IMPLANT
NEEDLE HYPO 25X1 1.5 SAFETY (NEEDLE) ×3 IMPLANT
NS IRRIG 1000ML POUR BTL (IV SOLUTION) ×3 IMPLANT
PACK BASIN DAY SURGERY FS (CUSTOM PROCEDURE TRAY) ×3 IMPLANT
PAD CAST 3X4 CTTN HI CHSV (CAST SUPPLIES) IMPLANT
PADDING CAST ABS 4INX4YD NS (CAST SUPPLIES)
PADDING CAST ABS COTTON 4X4 ST (CAST SUPPLIES) IMPLANT
PADDING CAST COTTON 3X4 STRL (CAST SUPPLIES)
SPLINT PLASTER CAST XFAST 3X15 (CAST SUPPLIES) IMPLANT
SPLINT PLASTER XTRA FASTSET 3X (CAST SUPPLIES)
STOCKINETTE 4X48 STRL (DRAPES) ×3 IMPLANT
SUT ETHILON 4 0 PS 2 18 (SUTURE) ×3 IMPLANT
SUT MON AB 5-0 P3 18 (SUTURE) ×3 IMPLANT
SWAB COLLECTION DEVICE MRSA (MISCELLANEOUS) IMPLANT
SWAB CULTURE ESWAB REG 1ML (MISCELLANEOUS) IMPLANT
SYR 20CC LL (SYRINGE) IMPLANT
SYR BULB 3OZ (MISCELLANEOUS) ×3 IMPLANT
SYR CONTROL 10ML LL (SYRINGE) ×3 IMPLANT
TOWEL OR 17X24 6PK STRL BLUE (TOWEL DISPOSABLE) ×6 IMPLANT
TUBE FEEDING ENTERAL 5FR 16IN (TUBING) IMPLANT
UNDERPAD 30X30 (UNDERPADS AND DIAPERS) ×3 IMPLANT

## 2017-02-21 NOTE — Op Note (Deleted)
  The note originally documented on this encounter has been moved the the encounter in which it belongs.  

## 2017-02-21 NOTE — H&P (Signed)
Donald Carter is an 41 y.o. male.   Chief Complaint: right index finger mass HPI: 41 yo rhd male with lesion/mass right index finger x 4 weeks.  It is bothersome to him.  It will break and bleed.  He wishes to have it removed.  Allergies: No Known Allergies  Past Medical History:  Diagnosis Date  . Hypertension     History reviewed. No pertinent surgical history.  Family History: History reviewed. No pertinent family history.  Social History:   reports that he has never smoked. He has never used smokeless tobacco. He reports that he drinks alcohol. He reports that he does not use drugs.  Medications: Medications Prior to Admission  Medication Sig Dispense Refill  . amLODipine (NORVASC) 10 MG tablet Take 1 tablet (10 mg total) by mouth daily. 30 tablet 3  . ibuprofen (ADVIL,MOTRIN) 200 MG tablet Take 200 mg by mouth every 6 (six) hours as needed for moderate pain.      No results found for this or any previous visit (from the past 48 hour(s)).  No results found.   A comprehensive review of systems was negative.  Blood pressure 131/83, pulse 74, temperature 97.8 F (36.6 C), temperature source Oral, height 6' (1.829 m), weight 93.9 kg (207 lb), SpO2 100 %.  General appearance: alert, cooperative and appears stated age Head: Normocephalic, without obvious abnormality, atraumatic Neck: supple, symmetrical, trachea midline Resp: clear to auscultation bilaterally Cardio: regular rate and rhythm GI: non-tender Extremities: Intact sensation and capillary refill all digits.  +epl/fpl/io.  Right index finger with open wound on pad with fullness of pad of finger. Pulses: 2+ and symmetric Skin: Skin color, texture, turgor normal. No rashes or lesions Neurologic: Grossly normal Incision/Wound: as above  Assessment/Plan Right index finger pyogenic granuloma.  Non operative and operative treatment options were discussed with the patient and patient wishes to proceed with  operative treatment. Risks, benefits, and alternatives of surgery were discussed and the patient agrees with the plan of care.   Donald Carter 02/21/2017, 8:42 AM

## 2017-02-21 NOTE — Discharge Instructions (Addendum)

## 2017-02-21 NOTE — Anesthesia Preprocedure Evaluation (Signed)
Anesthesia Evaluation  Patient identified by MRN, date of birth, ID band Patient awake    Reviewed: Allergy & Precautions, NPO status , Patient's Chart, lab work & pertinent test results  Airway Mallampati: II  TM Distance: >3 FB     Dental   Pulmonary neg pulmonary ROS,    breath sounds clear to auscultation       Cardiovascular hypertension,  Rhythm:Regular Rate:Normal     Neuro/Psych negative neurological ROS     GI/Hepatic negative GI ROS, Neg liver ROS,   Endo/Other  negative endocrine ROS  Renal/GU negative Renal ROS     Musculoskeletal   Abdominal   Peds  Hematology   Anesthesia Other Findings   Reproductive/Obstetrics                             Anesthesia Physical Anesthesia Plan  ASA: III  Anesthesia Plan: MAC and Bier Block   Post-op Pain Management:    Induction:   PONV Risk Score and Plan: 1 and Ondansetron, Dexamethasone, Treatment may vary due to age or medical condition, Propofol infusion and Midazolam  Airway Management Planned: Simple Face Mask  Additional Equipment:   Intra-op Plan:   Post-operative Plan:   Informed Consent: I have reviewed the patients History and Physical, chart, labs and discussed the procedure including the risks, benefits and alternatives for the proposed anesthesia with the patient or authorized representative who has indicated his/her understanding and acceptance.   Dental advisory given  Plan Discussed with: Anesthesiologist and CRNA  Anesthesia Plan Comments:         Anesthesia Quick Evaluation

## 2017-02-21 NOTE — Brief Op Note (Signed)
02/21/2017  9:24 AM  PATIENT:  Donald Carter  41 y.o. male  PRE-OPERATIVE DIAGNOSIS:  PYOGENIC GRANULOMA RIGHT INDEX FINGER L98.0  POST-OPERATIVE DIAGNOSIS:  PYOGENIC GRANULOMA RIGHT INDEX FINGER L98.0  PROCEDURE:  Procedure(s) with comments: INCISION AND DRAINAGE WITH EXCISION PYOGENIC GRANULOMA RIGHT INDEX FINGER (Right) - Bier block with MAC  SURGEON:  Surgeon(s) and Role:    * Leanora Cover, MD - Primary  PHYSICIAN ASSISTANT:   ASSISTANTS: none   ANESTHESIA:   Bier block with sedation  EBL:  Total I/O In: 700 [I.V.:700] Out: 3 [Blood:3]  BLOOD ADMINISTERED:none  DRAINS: none   LOCAL MEDICATIONS USED:  MARCAINE     SPECIMEN:  Source of Specimen:  right index finger  DISPOSITION OF SPECIMEN:  PATHOLOGY  COUNTS:  YES  TOURNIQUET:   Total Tourniquet Time Documented: Forearm (Right) - 27 minutes Total: Forearm (Right) - 27 minutes   DICTATION: .Other Dictation: Dictation Number [id number]  PLAN OF CARE: Discharge to home after PACU  PATIENT DISPOSITION:  PACU - hemodynamically stable.

## 2017-02-21 NOTE — Anesthesia Postprocedure Evaluation (Signed)
Anesthesia Post Note  Patient: Donald Carter  Procedure(s) Performed: Procedure(s) (LRB): INCISION AND DRAINAGE WITH EXCISION PYOGENIC GRANULOMA RIGHT INDEX FINGER (Right)     Patient location during evaluation: PACU Anesthesia Type: MAC Level of consciousness: awake Pain management: pain level controlled Vital Signs Assessment: post-procedure vital signs reviewed and stable Respiratory status: spontaneous breathing Cardiovascular status: stable Anesthetic complications: no    Last Vitals:  Vitals:   02/21/17 1000 02/21/17 1030  BP: (!) 132/97 134/86  Pulse: 71 74  Resp: 12 18  Temp:  (!) 36.4 C  SpO2: 100% 100%    Last Pain:  Vitals:   02/21/17 1030  TempSrc:   PainSc: 0-No pain                 Jethro Radke

## 2017-02-21 NOTE — Op Note (Signed)
NAMEJOSHAWN, SETIAWAN NO.:  [id number]  MEDICAL RECORD NO.:  [id number]  LOCATION:                                 FACILITY:  PHYSICIAN:  Betha Loa, MD        DATE OF BIRTH:  02-16-1976  DATE OF PROCEDURE:  02/21/2017 DATE OF DISCHARGE:                              OPERATIVE REPORT   PREOPERATIVE DIAGNOSIS:  Right index finger hygienic granuloma.  POSTOPERATIVE DIAGNOSIS:  Right index finger hygienic granuloma.  PROCEDURE:  Excision, pyogenic granuloma, right index finger.  SURGEON:  Betha Loa, MD  ASSISTANT:  None.  ANESTHESIA:  Bier block with sedation.  IV FLUIDS:  Per anesthesia flow sheet.  ESTIMATED BLOOD LOSS:  Minimal.  COMPLICATIONS:  None.  SPECIMENS:  Pyogenic granuloma to Pathology.  TOURNIQUET TIME:  27 minutes.  DISPOSITION:  Stable to PACU.  INDICATIONS:  Mr. Medica is a 41 year old male who has had a lesion of his right index finger on the pad for approximately 4 weeks.  He wished to have this removed.  Risks, benefits, and alternative of surgery were discussed including risk of blood loss, infection, damage to nerves, vessels, tendons, ligaments, bone, failure of surgery, need for additional surgery, complications with wound healing, continued pain, and recurrence of mass.  He voiced understanding these risks and elected to proceed.  OPERATIVE COURSE:  After being identified preoperatively by myself, the patient and I agreed upon procedure and site of procedure.  Surgical site was marked.  The risks, benefits, and alternatives of surgery were reviewed and he wished to proceed.  Surgical consent had been signed. He was given IV Ancef as preoperative antibiotic prophylaxis.  He was transferred to the operating room and placed on the operating room table in supine position with the right upper extremity on an arm board.  Bier block anesthesia was induced by anesthesiologist.  Right upper extremity was prepped and draped in a  normal sterile orthopedic fashion.  A surgical pause was performed between surgeons, Anesthesia, and operating room staff and all were in agreement as to the patient, procedure, and site of procedure.  Tourniquet at the proximal aspect of the forearm had been inflated for the Bier block.  Incision was made both proximally and distally at the mass in the pad of the finger.  This was carried into the subcutaneous tissues by spreading technique.  The mass was carefully delineated.  It was removed as best as possible in its entirety.  Care was taken to protect all other structures.  Bipolar electrocautery was used to obtain hemostasis.  No apparent mass was remaining.  The wound was copiously irrigated with sterile saline.  The damaged skin edges that were macerated were sharply removed with the knife.  The incision was changed into an elliptical incision as the area where the mass came through the skin was circular.  This allowed reapproximation of the skin edges.  A 5-0 Monocryl suture was used to close the wound.  A digital block was performed with 7 mL of 0.5% plain Marcaine.  The wound was dressed with sterile Xeroform and 4 x 4 and wrapped lightly with a Coban dressing.  Tourniquet was deflated  at 27 minutes.  Fingertips were pink with brisk capillary refill after deflation of tourniquet.  The operative drapes were broken down and the patient was awoken from anesthesia safely.  He was transferred back to stretcher and taken to PACU in stable condition.  POSTOPERATIVE PLAN:  I will see him back in the office in 1 week for postoperative followup.  We will give him Norco 5/325 one to two p.o. q.6 hours p.r.n. pain, dispensed #20.  He will continue on Bactrim DS 1 p.o. b.i.d. for the remainder of the 7-day course.  Mass was sent to Pathology for examination.     Betha Loa, MD   ______________________________ Betha Loa, MD    KK/MEDQ  D:  02/21/2017  T:  02/21/2017  Job:   [id number]

## 2017-02-21 NOTE — Transfer of Care (Signed)
Immediate Anesthesia Transfer of Care Note  Patient: Donald Carter  Procedure(s) Performed: Procedure(s) with comments: INCISION AND DRAINAGE WITH EXCISION PYOGENIC GRANULOMA RIGHT INDEX FINGER (Right) - Bier block with MAC  Patient Location: PACU  Anesthesia Type:MAC and Bier block  Level of Consciousness: awake and alert   Airway & Oxygen Therapy: Patient Spontanous Breathing and Patient connected to face mask oxygen  Post-op Assessment: Report given to RN and Post -op Vital signs reviewed and stable  Post vital signs: Reviewed and stable  Last Vitals:  Vitals:   02/21/17 0739  BP: 131/83  Pulse: 74  Temp: 36.6 C  SpO2: 100%    Last Pain:  Vitals:   02/21/17 0739  TempSrc: Oral  PainSc: 4       Patients Stated Pain Goal: 1 (07/68/08 8110)  Complications: No apparent anesthesia complications

## 2017-02-21 NOTE — Anesthesia Procedure Notes (Signed)
Anesthesia Regional Block: Bier block (IV Regional)   Pre-Anesthetic Checklist: ,, timeout performed, Correct Patient, Correct Site, Correct Laterality, Correct Procedure,, site marked, surgical consent,, at surgeon's request  Laterality: Right  Prep: alcohol swabs       Needles:  Injection technique: Single-shot  Needle Type: Other      Needle Gauge: 20     Additional Needles:   Procedures:,,,,,,, Esmarch exsanguination, single tourniquet utilized,  Narrative:  Start time: 02/21/2017 8:55 AM End time: 02/21/2017 8:56 AM Injection made incrementally with aspirations every 35 mL.  Performed by: Personally   Additional Notes: Esmark wrap, neg pulse, slowly injected 0.5% pres free Lidocaine, pt tol well, VSS, no neg S/s

## 2017-02-22 ENCOUNTER — Encounter (HOSPITAL_BASED_OUTPATIENT_CLINIC_OR_DEPARTMENT_OTHER): Payer: Self-pay | Admitting: Orthopedic Surgery

## 2017-09-10 ENCOUNTER — Other Ambulatory Visit: Payer: Self-pay

## 2017-09-10 ENCOUNTER — Encounter (HOSPITAL_COMMUNITY): Payer: Self-pay | Admitting: *Deleted

## 2017-09-10 ENCOUNTER — Emergency Department (HOSPITAL_COMMUNITY)
Admission: EM | Admit: 2017-09-10 | Discharge: 2017-09-10 | Disposition: A | Discharge status: Home / Self Care | Payer: Self-pay | Attending: Emergency Medicine | Admitting: Emergency Medicine

## 2017-09-10 DIAGNOSIS — B86 Scabies: Secondary | ICD-10-CM | POA: Insufficient documentation

## 2017-09-10 DIAGNOSIS — I10 Essential (primary) hypertension: Secondary | ICD-10-CM | POA: Insufficient documentation

## 2017-09-10 DIAGNOSIS — Z79899 Other long term (current) drug therapy: Secondary | ICD-10-CM | POA: Insufficient documentation

## 2017-09-10 MED ORDER — PERMETHRIN 5 % EX CREA: TOPICAL_CREAM | CUTANEOUS | 0 refills | Status: DC

## 2017-09-10 NOTE — ED Triage Notes (Signed)
Pt in c/o rash on his wrists and behind his knees, in between his fingers, pt thinks he came in contact with scabies at work

## 2017-09-10 NOTE — ED Provider Notes (Signed)
[facility] EMERGENCY DEPARTMENT Provider Note   CSN: [id number] Arrival date & time: 09/10/17  1559     History   Chief Complaint Chief Complaint  Patient presents with  . Rash    HPI Donald Carter is a 42 y.o. male with a history of hypertension who presents to the emergency department with complaint of rash which has been progressively worsening over the past 3-4 days.  Patient states that rash is located to bilateral wrists, upper extremity web spaces, posterior aspect of the knees, as well as his trunk.  Patient states the rash is pruritic.  No alleviating or aggravating factors.  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  His girlfriend has similar symptoms.  Denies fever, chills, nausea, vomiting, or difficulty breathing.  HPI  Past Medical History:  Diagnosis Date  . Hypertension     There are no active problems to display for this patient.   Past Surgical History:  Procedure Laterality Date  . INCISION AND DRAINAGE Right 02/21/2017   Procedure: INCISION AND DRAINAGE WITH EXCISION PYOGENIC GRANULOMA RIGHT INDEX FINGER;  Surgeon: Leanora Cover, MD;  Location: [facility];  Service: Orthopedics;  Laterality: Right;  Bier block with MAC       Home Medications    Prior to Admission medications   Medication Sig Start Date End Date Taking? Authorizing Provider  amLODipine (NORVASC) 10 MG tablet Take 1 tablet (10 mg total) by mouth daily. 02/07/17   McDonald, Mia A, PA-C  HYDROcodone-acetaminophen (NORCO) 5-325 MG tablet 1-2 tabs po q6 hours prn pain 02/21/17   Leanora Cover, MD  ibuprofen (ADVIL,MOTRIN) 200 MG tablet Take 200 mg by mouth every 6 (six) hours as needed for moderate pain.    [provider]  sulfamethoxazole-trimethoprim (BACTRIM DS) 800-160 MG tablet Take 1 tablet by mouth 2 (two) times daily. 02/21/17   Leanora Cover, MD    Family History History  reviewed. No pertinent family history.  Social History Social History   Tobacco Use  . Smoking status: Never Smoker  . Smokeless tobacco: Never Used  Substance Use Topics  . Alcohol use: Yes    Comment: ocasional  . Drug use: No     Allergies   Patient has no known allergies.   Review of Systems Review of Systems  Constitutional: Negative for chills and fever.  HENT:       Negative for intraoral swelling.  Respiratory: Negative for shortness of breath.   Gastrointestinal: Negative for nausea and vomiting.  Skin: Positive for rash.     Physical Exam Updated Vital Signs BP (!) 143/91 (BP Location: Right Arm)   Pulse 89   Temp 98.4 F (36.9 C) (Oral)   Resp 14   SpO2 100%   Physical Exam  Constitutional: He appears well-developed and well-nourished.  Non-toxic appearance. No distress.  HENT:  Head: Normocephalic and atraumatic.  Mouth/Throat: Uvula is midline and oropharynx is clear and moist.  Airway is patent.  No evidence of angioedema.  Eyes: Conjunctivae are normal. Right eye exhibits no discharge. Left eye exhibits no discharge.  Cardiovascular: Normal rate and regular rhythm.  Pulmonary/Chest: Effort normal and breath sounds normal. No stridor. He has no wheezes. He has no rales.  Neurological: He is alert.  Clear speech.   Skin: Rash (Multiple scattered small erythematous papules with excoriations, some with burrowing appearance.  These are located to dorsum of the hand and the  web spaces, bilateral posterior aspects of the knees, as well as diffusely to the trunk and uppe pelvic region) noted.  No pustules, fluctuance, induration. No overlying warmth.   Psychiatric: He has a normal mood and affect. His behavior is normal. Thought content normal.  Nursing note and vitals reviewed.      ED Treatments / Results  Labs (all labs ordered are listed, but only abnormal results are displayed) Labs Reviewed - No data to display  EKG  EKG  Interpretation None      Radiology No results found.  Procedures Procedures (including critical care time)  Medications Ordered in ED Medications - No data to display   Initial Impression / Assessment and Plan / ED Course  I have reviewed the triage vital signs and the nursing notes.  Pertinent labs & imaging results that were available during my care of the patient were reviewed by me and considered in my medical decision making (see chart for details).    Patient presents with findings consistent with scabies.  Patient is nontoxic-appearing, in no apparent distress, vitals WNL with the exception of elevated blood pressure, patient aware of need for recheck, no indication of HTN emergency. Will treat with Permethrin with repeat tx in 1 week- discussed application process. Instructed appropriate household cleaning including washing all clothing and linens with hot water as well as RID spray for car and sofa. I discussed treatment plan, need for PCP follow-up, and return precautions with the patient. Provided opportunity for questions, patient confirmed understanding and is in agreement with plan.     Final Clinical Impressions(s) / ED Diagnoses   Final diagnoses:  Scabies    ED Discharge Orders        Ordered    permethrin (ELIMITE) 5 % cream     09/10/17 1650       Trudi Morgenthaler, [place] R, PA-C 09/10/17 1659    Tegeler, Gwenyth Allegra, MD 09/11/17 0157

## 2017-09-10 NOTE — Discharge Instructions (Signed)
You were seen in the emergency department today and diagnosed with scabies, please see the attached handout for further information.  We have given you a prescription for permethrin, this is a topical cream you will need to apply to your entire body to treat the scabies.  You will need to leave the cream on for 8-14 hours following its application, then shower to rinse it off.  You will need to repeat this process in 1 week.  You will need to clean all clothing and linens in hot water.  Additionally use R.I.D. Spray to spray couch and car.    Follow-up with your primary care provider or the [facility] in 2 weeks for reevaluation following treatment.  Return to the emergency department for new or worsening symptoms including but not limited to fever, chills, nausea, vomiting, difficulty breathing, or any other concerns you may have.

## 2017-11-12 ENCOUNTER — Emergency Department (HOSPITAL_COMMUNITY)
Admission: EM | Admit: 2017-11-12 | Discharge: 2017-11-12 | Disposition: A | Discharge status: Home / Self Care | Payer: Self-pay | Attending: Emergency Medicine | Admitting: Emergency Medicine

## 2017-11-12 ENCOUNTER — Other Ambulatory Visit: Payer: Self-pay

## 2017-11-12 ENCOUNTER — Encounter (HOSPITAL_COMMUNITY): Payer: Self-pay | Admitting: Emergency Medicine

## 2017-11-12 DIAGNOSIS — I10 Essential (primary) hypertension: Secondary | ICD-10-CM | POA: Insufficient documentation

## 2017-11-12 DIAGNOSIS — Z202 Contact with and (suspected) exposure to infections with a predominantly sexual mode of transmission: Secondary | ICD-10-CM | POA: Insufficient documentation

## 2017-11-12 DIAGNOSIS — Z79899 Other long term (current) drug therapy: Secondary | ICD-10-CM | POA: Insufficient documentation

## 2017-11-12 NOTE — ED Provider Notes (Signed)
[facility] EMERGENCY DEPARTMENT Provider Note   CSN: [id number] Arrival date & time: 11/12/17  1455     History   Chief Complaint Chief Complaint  Patient presents with  . Exposure to STD    HPI Donald Carter is a 42 y.o. male.  HPI   42 year old male presents today with complaints of exposure to STD.  Patient notes 1 of his sexual partners tested positive for trichomoniasis.  Patient notes that he went to the health department and received metronidazole.  He was requesting testing for trichomoniasis at that time and they refused to do it.  Patient did receive testing for HIV syphilis gonorrhea and chlamydia.  She denies any penile discharge or any physical complaints.  He is sexually active with one male partner.  Past Medical History:  Diagnosis Date  . Hypertension     There are no active problems to display for this patient.   Past Surgical History:  Procedure Laterality Date  . INCISION AND DRAINAGE Right 02/21/2017   Procedure: INCISION AND DRAINAGE WITH EXCISION PYOGENIC GRANULOMA RIGHT INDEX FINGER;  Surgeon: Leanora Cover, MD;  Location: [facility];  Service: Orthopedics;  Laterality: Right;  Bier block with MAC        Home Medications    Prior to Admission medications   Medication Sig Start Date End Date Taking? Authorizing Provider  amLODipine (NORVASC) 10 MG tablet Take 1 tablet (10 mg total) by mouth daily. 02/07/17   McDonald, Mia A, PA-C  HYDROcodone-acetaminophen (NORCO) 5-325 MG tablet 1-2 tabs po q6 hours prn pain 02/21/17   Leanora Cover, MD  ibuprofen (ADVIL,MOTRIN) 200 MG tablet Take 200 mg by mouth every 6 (six) hours as needed for moderate pain.    [provider]  permethrin (ELIMITE) 5 % cream Thoroughly massage cream from head to soles of feet; leave on for 8 to 14 hours before removing with shower or bath. Repeat in 1 week. 09/10/17   Petrucelli, Samantha R, PA-C  sulfamethoxazole-trimethoprim (BACTRIM  DS) 800-160 MG tablet Take 1 tablet by mouth 2 (two) times daily. 02/21/17   Leanora Cover, MD    Family History History reviewed. No pertinent family history.  Social History Social History   Tobacco Use  . Smoking status: Never Smoker  . Smokeless tobacco: Never Used  Substance Use Topics  . Alcohol use: Yes    Comment: ocasional  . Drug use: No     Allergies   Patient has no known allergies.   Review of Systems Review of Systems  All other systems reviewed and are negative.    Physical Exam Updated Vital Signs BP (!) 163/111   Pulse 90   Temp 98.4 F (36.9 C) (Oral)   Resp 18   SpO2 100%   Physical Exam  Constitutional: He is oriented to person, place, and time. He appears well-developed and well-nourished.  HENT:  Head: Normocephalic and atraumatic.  Eyes: Pupils are equal, round, and reactive to light. Conjunctivae are normal. Right eye exhibits no discharge. Left eye exhibits no discharge. No scleral icterus.  Neck: Normal range of motion. No JVD present. No tracheal deviation present.  Pulmonary/Chest: Effort normal. No stridor.  Neurological: He is alert and oriented to person, place, and time. Coordination normal.  Psychiatric: He has a normal mood and affect. His behavior is normal. Judgment and thought content normal.  Nursing note and vitals reviewed.  ED Treatments / Results  Labs (all labs ordered are listed, but only  abnormal results are displayed) Labs Reviewed  URINALYSIS, ROUTINE W REFLEX MICROSCOPIC    EKG None  Radiology No results found.  Procedures Procedures (including critical care time)  Medications Ordered in ED Medications - No data to display   Initial Impression / Assessment and Plan / ED Course  I have reviewed the triage vital signs and the nursing notes.  Pertinent labs & imaging results that were available during my care of the patient were reviewed by me and considered in my medical decision making (see chart for  details).     42 year old male presents today for second opinion.  Patient was exposed to trichomoniasis, he received the appropriate treatment, he is asymptomatic.  I informed him that we do not test to cure for trichomoniasis.  Patient has other STD pending that was performed at the health department, he is to return with any new or worsening signs or symptoms.  Final Clinical Impressions(s) / ED Diagnoses   Final diagnoses:  STD exposure    ED Discharge Orders    None       Francee Gentile 11/12/17 Virgina Jock, MD 11/18/17 [phone]

## 2017-11-12 NOTE — Discharge Instructions (Addendum)
Please read attached information. If you experience any new or worsening signs or symptoms please return to the emergency room for evaluation.  °

## 2017-11-12 NOTE — ED Triage Notes (Signed)
Pt to ER for evaluation of trichomoniasis, states was told by someone he was in sexual contact with that she was exposed. Pt denies symptoms.

## 2018-07-20 ENCOUNTER — Encounter (HOSPITAL_COMMUNITY): Payer: Self-pay

## 2018-07-20 ENCOUNTER — Emergency Department (HOSPITAL_COMMUNITY)
Admission: EM | Admit: 2018-07-20 | Discharge: 2018-07-20 | Disposition: A | Discharge status: Home / Self Care | Payer: Self-pay | Attending: Emergency Medicine | Admitting: Emergency Medicine

## 2018-07-20 ENCOUNTER — Emergency Department (HOSPITAL_COMMUNITY): Payer: Self-pay

## 2018-07-20 DIAGNOSIS — R81 Glycosuria: Secondary | ICD-10-CM | POA: Insufficient documentation

## 2018-07-20 DIAGNOSIS — I10 Essential (primary) hypertension: Secondary | ICD-10-CM | POA: Insufficient documentation

## 2018-07-20 DIAGNOSIS — R51 Headache: Secondary | ICD-10-CM | POA: Insufficient documentation

## 2018-07-20 DIAGNOSIS — Z79899 Other long term (current) drug therapy: Secondary | ICD-10-CM | POA: Insufficient documentation

## 2018-07-20 LAB — CBC WITH DIFFERENTIAL/PLATELET
ABS IMMATURE GRANULOCYTES: 0.01 10*3/uL (ref 0.00–0.07)
BASOS ABS: 0 10*3/uL (ref 0.0–0.1)
Basophils Relative: 0 %
EOS PCT: 1 %
Eosinophils Absolute: 0 10*3/uL (ref 0.0–0.5)
HEMATOCRIT: 44.5 % (ref 39.0–52.0)
HEMOGLOBIN: 15.6 g/dL (ref 13.0–17.0)
IMMATURE GRANULOCYTES: 0 %
LYMPHS ABS: 1.5 10*3/uL (ref 0.7–4.0)
LYMPHS PCT: 36 %
MCH: 28.9 pg (ref 26.0–34.0)
MCHC: 35.1 g/dL (ref 30.0–36.0)
MCV: 82.4 fL (ref 80.0–100.0)
Monocytes Absolute: 0.2 10*3/uL (ref 0.1–1.0)
Monocytes Relative: 5 %
NEUTROS PCT: 58 %
NRBC: 0 % (ref 0.0–0.2)
Neutro Abs: 2.5 10*3/uL (ref 1.7–7.7)
PLATELETS: 235 10*3/uL (ref 150–400)
RBC: 5.4 MIL/uL (ref 4.22–5.81)
RDW: 12.1 % (ref 11.5–15.5)
WBC: 4.3 10*3/uL (ref 4.0–10.5)

## 2018-07-20 LAB — BASIC METABOLIC PANEL
Anion gap: 11 (ref 5–15)
BUN: 11 mg/dL (ref 6–20)
CO2: 25 mmol/L (ref 22–32)
CREATININE: 1.22 mg/dL (ref 0.61–1.24)
Calcium: 9 mg/dL (ref 8.9–10.3)
Chloride: 99 mmol/L (ref 98–111)
GFR calc non Af Amer: >60 mL/min (ref >60)
Glucose, Bld: 245 mg/dL — ABNORMAL HIGH (ref 70–99)
POTASSIUM: 3.7 mmol/L (ref 3.5–5.1)
SODIUM: 135 mmol/L (ref 135–145)

## 2018-07-20 LAB — URINALYSIS, ROUTINE W REFLEX MICROSCOPIC
Bacteria, UA: NONE SEEN
Bilirubin Urine: NEGATIVE
Hgb urine dipstick: NEGATIVE
Ketones, ur: NEGATIVE mg/dL
Leukocytes, UA: NEGATIVE
Nitrite: NEGATIVE
Protein, ur: NEGATIVE mg/dL
Specific Gravity, Urine: 1.006 (ref 1.005–1.030)
pH: 5 (ref 5.0–8.0)

## 2018-07-20 LAB — I-STAT TROPONIN, ED
Troponin i, poc: 0 ng/mL (ref 0.00–0.08)
Troponin i, poc: 0 ng/mL (ref 0.00–0.08)

## 2018-07-20 MED ORDER — METFORMIN HCL 500 MG PO TABS: 500.0000 mg | ORAL_TABLET | Freq: Two times a day (BID) | ORAL | 0 refills | Status: DC

## 2018-07-20 MED ORDER — AMLODIPINE BESYLATE 5 MG PO TABS
10.0000 mg | ORAL_TABLET | Freq: Once | ORAL | Status: AC
  Administered 2018-07-20: 10 mg via ORAL
  Filled 2018-07-20: qty 2

## 2018-07-20 MED ORDER — AMLODIPINE BESYLATE 10 MG PO TABS: 10.0000 mg | ORAL_TABLET | Freq: Every day | ORAL | 3 refills | Status: DC

## 2018-07-20 NOTE — ED Triage Notes (Signed)
Pt presents for evaluation of hypertension. No hx or meds. Pt states he had cp yesterday and has slight headache.

## 2018-07-20 NOTE — ED Provider Notes (Signed)
[place] EMERGENCY DEPARTMENT Provider Note   CSN: [id number] Arrival date & time: 07/20/18  1442     History   Chief Complaint Chief Complaint  Patient presents with  . Hypertension    HPI Donald Carter is a 43 y.o. male with a past medical history of hypertension not currently on medication who presents to ED for evaluation of hypertension.  States that he had left-sided chest pain that lasted several minutes yesterday while at work.  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  The pain was sharp.  He went to [facility], checked his blood pressure there and thought to be 170s over 120s.  He took 1 dose of his mother's antihypertensive.  He began having chest pain similarly today while at work doing a similar task.  He then got his blood pressure checked again which was just as high.  He was concerned because although he took the antihypertensive, he continued to be hypertensive.  He had been told in the past that he has history of hypertension but could not afford medications.  No history of chest pain in the past.  He does note that for the past month he is getting intermittent headaches and vision changes in his left eye but no symptoms for the past week.  He denies shortness of breath, injuries or falls, numbness in arms or legs, vomiting or coughing up blood, changes to urination.  He denies any chest pain currently.  Denies any URI symptoms.  HPI  Past Medical History:  Diagnosis Date  . Hypertension     There are no active problems to display for this patient.   Past Surgical History:  Procedure Laterality Date  . INCISION AND DRAINAGE Right 02/21/2017   Procedure: INCISION AND DRAINAGE WITH EXCISION PYOGENIC GRANULOMA RIGHT INDEX FINGER;  Surgeon: Leanora Cover, MD;  Location: [place];  Service: Orthopedics;  Laterality: Right;  Bier block with MAC        Home Medications    Prior to Admission medications     Medication Sig Start Date End Date Taking? Authorizing Provider  amLODipine (NORVASC) 10 MG tablet Take 1 tablet (10 mg total) by mouth daily. 07/20/18   Delia Heady, PA-C  HYDROcodone-acetaminophen (NORCO) 5-325 MG tablet 1-2 tabs po q6 hours prn pain 02/21/17   Leanora Cover, MD  ibuprofen (ADVIL,MOTRIN) 200 MG tablet Take 200 mg by mouth every 6 (six) hours as needed for moderate pain.    [provider]  metFORMIN (GLUCOPHAGE) 500 MG tablet Take 1 tablet (500 mg total) by mouth 2 (two) times daily with a meal. 07/20/18   Averi Cacioppo, PA-C  permethrin (ELIMITE) 5 % cream Thoroughly massage cream from head to soles of feet; leave on for 8 to 14 hours before removing with shower or bath. Repeat in 1 week. 09/10/17   Petrucelli, Samantha R, PA-C  sulfamethoxazole-trimethoprim (BACTRIM DS) 800-160 MG tablet Take 1 tablet by mouth 2 (two) times daily. 02/21/17   Leanora Cover, MD    Family History No family history on file.  Social History Social History   Tobacco Use  . Smoking status: Never Smoker  . Smokeless tobacco: Never Used  Substance Use Topics  . Alcohol use: Yes    Comment: ocasional  . Drug use: No     Allergies   Patient has no known allergies.   Review of Systems Review of Systems  Constitutional: Negative for appetite change, chills and fever.  HENT: Negative for ear pain, rhinorrhea, sneezing and sore throat.   Eyes: Positive for visual disturbance. Negative for photophobia.  Respiratory: Negative for cough, chest tightness, shortness of breath and wheezing.   Cardiovascular: Positive for chest pain. Negative for palpitations.  Gastrointestinal: Negative for abdominal pain, blood in stool, constipation, diarrhea, nausea and vomiting.  Genitourinary: Negative for dysuria, hematuria and urgency.  Musculoskeletal: Negative for myalgias.  Skin: Negative for rash.  Neurological: Positive for headaches. Negative for dizziness, weakness and light-headedness.      Physical Exam Updated Vital Signs BP (!) 166/115   Pulse 80   Temp 98.6 F (37 C) (Oral)   Resp 18   Ht 6' (1.829 m)   Wt 101.6 kg   SpO2 100%   BMI 30.38 kg/m   Physical Exam Vitals signs and nursing note reviewed.  Constitutional:      General: He is not in acute distress.    Appearance: He is well-developed.  HENT:     Head: Normocephalic and atraumatic.     Nose: Nose normal.  Eyes:     General: No scleral icterus.       Left eye: No discharge.     Conjunctiva/sclera: Conjunctivae normal.  Neck:     Musculoskeletal: Normal range of motion and neck supple.  Cardiovascular:     Rate and Rhythm: Normal rate and regular rhythm.     Heart sounds: Normal heart sounds. No murmur. No friction rub. No gallop.   Pulmonary:     Effort: Pulmonary effort is normal. No respiratory distress.     Breath sounds: Normal breath sounds.  Abdominal:     General: Bowel sounds are normal. There is no distension.     Palpations: Abdomen is soft.     Tenderness: There is no abdominal tenderness. There is no guarding.  Musculoskeletal: Normal range of motion.  Skin:    General: Skin is warm and dry.     Findings: No rash.  Neurological:     General: No focal deficit present.     Mental Status: He is alert and oriented to person, place, and time.     Cranial Nerves: No cranial nerve deficit.     Sensory: No sensory deficit.     Motor: No weakness or abnormal muscle tone.     Coordination: Coordination normal.      ED Treatments / Results  Labs (all labs ordered are listed, but only abnormal results are displayed) Labs Reviewed  BASIC METABOLIC PANEL - Abnormal; Notable for the following components:      Result Value   Glucose, Bld 245 (*)    All other components within normal limits  URINALYSIS, ROUTINE W REFLEX MICROSCOPIC - Abnormal; Notable for the following components:   Color, Urine STRAW (*)    Glucose, UA >=500 (*)    All other components within normal limits   CBC WITH DIFFERENTIAL/PLATELET  I-STAT TROPONIN, ED  I-STAT TROPONIN, ED    EKG EKG Interpretation  Date/Time:  Sunday July 20 2018 14:47:13 EST Ventricular Rate:  90 PR Interval:  176 QRS Duration: 90 QT Interval:  354 QTC Calculation: 433 R Axis:   41 Text Interpretation:  Normal sinus rhythm T wave abnormality, consider inferolateral ischemia Abnormal ECG new TWI in the lateral leads and inferior leads new changes noted Confirmed by Varney Biles [phone]) on 07/20/2018 3:21:50 PM   Radiology Dg Chest 2 View  Result Date: 07/20/2018 CLINICAL DATA:  Increased blood pressure since yesterday EXAM: CHEST -  2 VIEW COMPARISON:  June 22, 2016 FINDINGS: The heart size and mediastinal contours are within normal limits. Both lungs are clear. The visualized skeletal structures are unremarkable. IMPRESSION: No active cardiopulmonary disease. Electronically Signed   By: Abelardo Diesel M.D.   On: 07/20/2018 16:48   Ct Head Wo Contrast  Result Date: 07/20/2018 CLINICAL DATA:  Headache, high blood pressure EXAM: CT HEAD WITHOUT CONTRAST TECHNIQUE: Contiguous axial images were obtained from the base of the skull through the vertex without intravenous contrast. COMPARISON:  None. FINDINGS: Brain: No acute intracranial hemorrhage. No focal mass lesion. No CT evidence of acute infarction. No midline shift or mass effect. No hydrocephalus. Basilar cisterns are patent. Vascular: No hyperdense vessel or unexpected calcification. Skull: Normal. Negative for fracture or focal lesion. Sinuses/Orbits: Paranasal sinuses and mastoid air cells are clear. Orbits are clear. Other: None. IMPRESSION: No acute intracranial findings Electronically Signed   By: Suzy Bouchard M.D.   On: 07/20/2018 15:59    Procedures Procedures (including critical care time)  Medications Ordered in ED Medications  amLODipine (NORVASC) tablet 10 mg (10 mg Oral Given 07/20/18 1841)     Initial Impression / Assessment and  Plan / ED Course  I have reviewed the triage vital signs and the nursing notes.  Pertinent labs & imaging results that were available during my care of the patient were reviewed by me and considered in my medical decision making (see chart for details).     43 year old male with a known history of hypertension and diabetes presents to ED for hypertension.  He had chest pain that lasted several minutes yesterday and several minutes today but none on my eval.  On exam he is overall well-appearing.  No deficits on neurological exam noted.  Patient is hypertensive to 498 systolic.  Work-up significant for EKG showing new T wave inversions.  Chest x-ray is unremarkable.  CT of the head is negative.  Initial troponin negative.  BMP shows high glucose at 245, glucosuria.  CBC is unremarkable.  Delta troponin is negative.  I had a discussion with the patient regarding importance of taking his medications.  He remains chest pain-free.  I can see that he was prescribed amlodipine 10 mg as well as metformin 500 milligrams as of 2 years ago.  Patient is requesting financial assistance with medication.  I will contact heart care office to follow-up due to his new T wave inversions.  We will also have case management consult for financial assistance.  Patient given a dose of amlodipine here.  Will advise him to return to ED for any severe worsening symptoms.  Patient is hemodynamically stable, in NAD, and able to ambulate in the ED. Evaluation does not show pathology that would require ongoing emergent intervention or inpatient treatment. I explained the diagnosis to the patient. Pain has been managed and has no complaints prior to discharge. Patient is comfortable with above plan and is stable for discharge at this time. All questions were answered prior to disposition. Strict return precautions for returning to the ED were discussed. Encouraged follow up with PCP.    Portions of this note were generated with Geographical information systems officer. Dictation errors may occur despite best attempts at proofreading.    Final Clinical Impressions(s) / ED Diagnoses   Final diagnoses:  Hypertension, unspecified type  Glucosuria    ED Discharge Orders         Ordered    amLODipine (NORVASC) 10 MG tablet  Daily  07/20/18 1909    metFORMIN (GLUCOPHAGE) 500 MG tablet  2 times daily with meals     07/20/18 1909           Delia Heady, PA-C 07/20/18 [place], Ankit, MD 07/22/18 2325

## 2018-07-20 NOTE — ED Notes (Signed)
Patient verbalizes understanding of discharge instructions. Opportunity for questioning and answers were provided. Armband removed by staff, pt discharged from ED. Pt ambulatory to lobby.  

## 2018-07-20 NOTE — Discharge Instructions (Signed)
You will need to take your blood pressure medicine and diabetes medication as prescribed. It is important that you take these medications, establish care with a primary care provider and follow-up at the cardiologist office listed below.  I have contacted them as well as the case manager regarding financial assistance with your medications. Return to ED if your symptoms worsen, chest pain, shortness of breath, blurry vision or numbness.

## 2019-05-22 ENCOUNTER — Encounter: Payer: Self-pay | Admitting: Internal Medicine

## 2019-05-22 ENCOUNTER — Ambulatory Visit: Payer: Self-pay | Attending: Internal Medicine | Admitting: Internal Medicine

## 2019-05-22 ENCOUNTER — Ambulatory Visit: Payer: Self-pay | Admitting: Pharmacist

## 2019-05-22 ENCOUNTER — Encounter: Payer: Self-pay | Admitting: Pharmacist

## 2019-05-22 ENCOUNTER — Other Ambulatory Visit: Payer: Self-pay

## 2019-05-22 VITALS — BP 193/107 | HR 87 | Temp 98.6°F | Resp 16 | Ht 72.0 in | Wt 231.6 lb

## 2019-05-22 DIAGNOSIS — Z7189 Other specified counseling: Secondary | ICD-10-CM

## 2019-05-22 DIAGNOSIS — E1159 Type 2 diabetes mellitus with other circulatory complications: Secondary | ICD-10-CM | POA: Insufficient documentation

## 2019-05-22 DIAGNOSIS — Z2821 Immunization not carried out because of patient refusal: Secondary | ICD-10-CM

## 2019-05-22 DIAGNOSIS — E119 Type 2 diabetes mellitus without complications: Secondary | ICD-10-CM | POA: Insufficient documentation

## 2019-05-22 DIAGNOSIS — I10 Essential (primary) hypertension: Secondary | ICD-10-CM | POA: Insufficient documentation

## 2019-05-22 LAB — POCT GLYCOSYLATED HEMOGLOBIN (HGB A1C): HbA1c, POC (controlled diabetic range): 12.3 % — AB (ref 0.0–7.0)

## 2019-05-22 LAB — GLUCOSE, POCT (MANUAL RESULT ENTRY): POC Glucose: 415 mg/dl — AB (ref 70–99)

## 2019-05-22 MED ORDER — TRUE METRIX BLOOD GLUCOSE TEST VI STRP: ORAL_STRIP | 12 refills | Status: DC

## 2019-05-22 MED ORDER — AMLODIPINE BESYLATE 10 MG PO TABS: 10.0000 mg | ORAL_TABLET | Freq: Every day | ORAL | 3 refills | Status: DC

## 2019-05-22 MED ORDER — TRUE METRIX METER W/DEVICE KIT: PACK | 0 refills | Status: DC

## 2019-05-22 MED ORDER — PEN NEEDLES 31G X 8 MM MISC: 6 refills | Status: DC

## 2019-05-22 MED ORDER — TRUEPLUS LANCETS 28G MISC: 4 refills | Status: DC

## 2019-05-22 MED ORDER — LANTUS SOLOSTAR 100 UNIT/ML ~~LOC~~ SOPN: 12.0000 [IU] | PEN_INJECTOR | Freq: Every day | SUBCUTANEOUS | 11 refills | Status: DC

## 2019-05-22 MED ORDER — METFORMIN HCL 500 MG PO TABS: 500.0000 mg | ORAL_TABLET | Freq: Two times a day (BID) | ORAL | 5 refills | Status: DC

## 2019-05-22 MED FILL — !LANTUS SOLOSTAR 100UNITS/M: 100 | 25 days supply | Qty: 3 | Fill #0

## 2019-05-22 MED FILL — TRUEplus LANCETS 28G MISC: 30 days supply | Qty: 100 | Fill #0

## 2019-05-22 MED FILL — !TRUE METRIX BLOOD GLUCOSE: 30 days supply | Qty: 1 | Fill #0

## 2019-05-22 MED FILL — TRUE METRIX GLUCOSE TEST ST: 30 days supply | Qty: 100 | Fill #0

## 2019-05-22 MED FILL — TRUEPLUS PEN NDL 31GX5/16: 31G X 8 MM | 30 days supply | Qty: 100 | Fill #0

## 2019-05-22 NOTE — Patient Instructions (Signed)
Diabetes Mellitus and Standards of Medical Care Managing diabetes (diabetes mellitus) can be complicated. Your diabetes treatment may be managed by a team of health care providers, including:  A physician who specializes in diabetes (endocrinologist).  A nurse practitioner or physician assistant.  Nurses.  A diet and nutrition specialist (registered dietitian).  A certified diabetes educator (CDE).  An exercise specialist.  A pharmacist.  An eye doctor.  A foot specialist (podiatrist).  A dentist.  A primary care provider.  A mental health provider.  Your health care providers follow guidelines to help you get the best quality of care. The following schedule is a general guideline for your diabetes management plan. Your health care providers may give you more specific instructions. Physical exams Upon being diagnosed with diabetes mellitus, and each year after that, your health care provider will ask about your medical and family history. He or she will also do a physical exam. Your exam may include:  Measuring your height, weight, and body mass index (BMI).  Checking your blood pressure. This will be done at every routine medical visit. Your target blood pressure may vary depending on your medical conditions, your age, and other factors.  Thyroid gland exam.  Skin exam.  Screening for damage to your nerves (peripheral neuropathy). This may include checking the pulse in your legs and feet and checking the level of sensation in your hands and feet.  A complete foot exam to inspect the structure and skin of your feet, including checking for cuts, bruises, redness, blisters, sores, or other problems.  Screening for blood vessel (vascular) problems, which may include checking the pulse in your legs and feet and checking your temperature. Blood tests Depending on your treatment plan and your personal needs, you may have the following tests done:  HbA1c (hemoglobin A1c). This  test provides information about blood sugar (glucose) control over the previous 2-3 months. It is used to adjust your treatment plan, if needed. This test will be done: ? At least 2 times a year, if you are meeting your treatment goals. ? 4 times a year, if you are not meeting your treatment goals or if treatment goals have changed.  Lipid testing, including total, LDL, and HDL cholesterol and triglyceride levels. ? The goal for LDL is less than 100 mg/dL (5.5 mmol/L). If you are at high risk for complications, the goal is less than 70 mg/dL (3.9 mmol/L). ? The goal for HDL is 40 mg/dL (2.2 mmol/L) or higher for men and 50 mg/dL (2.8 mmol/L) or higher for women. An HDL cholesterol of 60 mg/dL (3.3 mmol/L) or higher gives some protection against heart disease. ? The goal for triglycerides is less than 150 mg/dL (8.3 mmol/L).  Liver function tests.  Kidney function tests.  Thyroid function tests. Dental and eye exams  Visit your dentist two times a year.  If you have type 1 diabetes, your health care provider may recommend an eye exam 3-5 years after you are diagnosed, and then once a year after your first exam. ? For children with type 1 diabetes, a health care provider may recommend an eye exam when your child is age 10 or older and has had diabetes for 3-5 years. After the first exam, your child should get an eye exam once a year.  If you have type 2 diabetes, your health care provider may recommend an eye exam as soon as you are diagnosed, and then once a year after your first exam. Immunizations     The yearly flu (influenza) vaccine is recommended for everyone 6 months or older who has diabetes.  The pneumonia (pneumococcal) vaccine is recommended for everyone 2 years or older who has diabetes. If you are 65 or older, you may get the pneumonia vaccine as a series of two separate shots.  The hepatitis B vaccine is recommended for adults shortly after being diagnosed with diabetes.   Adults and children with diabetes should receive all other vaccines according to age-specific recommendations from the Centers for Disease Control and Prevention (CDC). Mental and emotional health Screening for symptoms of eating disorders, anxiety, and depression is recommended at the time of diagnosis and afterward as needed. If your screening shows that you have symptoms (positive screening result), you may need more evaluation and you may work with a mental health care provider. Treatment plan Your treatment plan will be reviewed at every medical visit. You and your health care provider will discuss:  How you are taking your medicines, including insulin.  Any side effects you are experiencing.  Your blood glucose target goals.  The frequency of your blood glucose monitoring.  Lifestyle habits, such as activity level as well as tobacco, alcohol, and substance use. Diabetes self-management education Your health care provider will assess how well you are monitoring your blood glucose levels and whether you are taking your insulin correctly. He or she may refer you to:  A certified diabetes educator to manage your diabetes throughout your life, starting at diagnosis.  A registered dietitian who can create or review your personal nutrition plan.  An exercise specialist who can discuss your activity level and exercise plan. Summary  Managing diabetes (diabetes mellitus) can be complicated. Your diabetes treatment may be managed by a team of health care providers.  Your health care providers follow guidelines in order to help you get the best quality of care.  Standards of care including having regular physical exams, blood tests, blood pressure monitoring, immunizations, screening tests, and education about how to manage your diabetes.  Your health care providers may also give you more specific instructions based on your individual health. This information is not intended to replace  advice given to you by your health care provider. Make sure you discuss any questions you have with your health care provider. Document Released: 04/15/2009 Document Revised: 03/07/2018 Document Reviewed: 03/16/2016 Elsevier Patient Education  2020 Elsevier Inc.   Diabetes Mellitus and Nutrition, Adult When you have diabetes (diabetes mellitus), it is very important to have healthy eating habits because your blood sugar (glucose) levels are greatly affected by what you eat and drink. Eating healthy foods in the appropriate amounts, at about the same times every day, can help you:  Control your blood glucose.  Lower your risk of heart disease.  Improve your blood pressure.  Reach or maintain a healthy weight. Every person with diabetes is different, and each person has different needs for a meal plan. Your health care provider may recommend that you work with a diet and nutrition specialist (dietitian) to make a meal plan that is best for you. Your meal plan may vary depending on factors such as:  The calories you need.  The medicines you take.  Your weight.  Your blood glucose, blood pressure, and cholesterol levels.  Your activity level.  Other health conditions you have, such as heart or kidney disease. How do carbohydrates affect me? Carbohydrates, also called carbs, affect your blood glucose level more than any other type of food. Eating carbs naturally   raises the amount of glucose in your blood. Carb counting is a method for keeping track of how many carbs you eat. Counting carbs is important to keep your blood glucose at a healthy level, especially if you use insulin or take certain oral diabetes medicines. It is important to know how many carbs you can safely have in each meal. This is different for every person. Your dietitian can help you calculate how many carbs you should have at each meal and for each snack. Foods that contain carbs include:  Bread, cereal, rice, pasta,  and crackers.  Potatoes and corn.  Peas, beans, and lentils.  Milk and yogurt.  Fruit and juice.  Desserts, such as cakes, cookies, ice cream, and candy. How does alcohol affect me? Alcohol can cause a sudden decrease in blood glucose (hypoglycemia), especially if you use insulin or take certain oral diabetes medicines. Hypoglycemia can be a life-threatening condition. Symptoms of hypoglycemia (sleepiness, dizziness, and confusion) are similar to symptoms of having too much alcohol. If your health care provider says that alcohol is safe for you, follow these guidelines:  Limit alcohol intake to no more than 1 drink per day for nonpregnant women and 2 drinks per day for men. One drink equals 12 oz of beer, 5 oz of wine, or 1 oz of hard liquor.  Do not drink on an empty stomach.  Keep yourself hydrated with water, diet soda, or unsweetened iced tea.  Keep in mind that regular soda, juice, and other mixers may contain a lot of sugar and must be counted as carbs. What are tips for following this plan?  Reading food labels  Start by checking the serving size on the "Nutrition Facts" label of packaged foods and drinks. The amount of calories, carbs, fats, and other nutrients listed on the label is based on one serving of the item. Many items contain more than one serving per package.  Check the total grams (g) of carbs in one serving. You can calculate the number of servings of carbs in one serving by dividing the total carbs by 15. For example, if a food has 30 g of total carbs, it would be equal to 2 servings of carbs.  Check the number of grams (g) of saturated and trans fats in one serving. Choose foods that have low or no amount of these fats.  Check the number of milligrams (mg) of salt (sodium) in one serving. Most people should limit total sodium intake to less than 2,300 mg per day.  Always check the nutrition information of foods labeled as "low-fat" or "nonfat". These foods  may be higher in added sugar or refined carbs and should be avoided.  Talk to your dietitian to identify your daily goals for nutrients listed on the label. Shopping  Avoid buying canned, premade, or processed foods. These foods tend to be high in fat, sodium, and added sugar.  Shop around the outside edge of the grocery store. This includes fresh fruits and vegetables, bulk grains, fresh meats, and fresh dairy. Cooking  Use low-heat cooking methods, such as baking, instead of high-heat cooking methods like deep frying.  Cook using healthy oils, such as olive, canola, or sunflower oil.  Avoid cooking with butter, cream, or high-fat meats. Meal planning  Eat meals and snacks regularly, preferably at the same times every day. Avoid going long periods of time without eating.  Eat foods high in fiber, such as fresh fruits, vegetables, beans, and whole grains. Talk to your   dietitian about how many servings of carbs you can eat at each meal.  Eat 4-6 ounces (oz) of lean protein each day, such as lean meat, chicken, fish, eggs, or tofu. One oz of lean protein is equal to: ? 1 oz of meat, chicken, or fish. ? 1 egg. ?  cup of tofu.  Eat some foods each day that contain healthy fats, such as avocado, nuts, seeds, and fish. Lifestyle  Check your blood glucose regularly.  Exercise regularly as told by your health care provider. This may include: ? 150 minutes of moderate-intensity or vigorous-intensity exercise each week. This could be brisk walking, biking, or water aerobics. ? Stretching and doing strength exercises, such as yoga or weightlifting, at least 2 times a week.  Take medicines as told by your health care provider.  Do not use any products that contain nicotine or tobacco, such as cigarettes and e-cigarettes. If you need help quitting, ask your health care provider.  Work with a counselor or diabetes educator to identify strategies to manage stress and any emotional and social  challenges. Questions to ask a health care provider  Do I need to meet with a diabetes educator?  Do I need to meet with a dietitian?  What number can I call if I have questions?  When are the best times to check my blood glucose? Where to find more information:  American Diabetes Association: diabetes.org  Academy of Nutrition and Dietetics: www.eatright.org  National Institute of Diabetes and Digestive and Kidney Diseases (NIH): www.niddk.nih.gov Summary  A healthy meal plan will help you control your blood glucose and maintain a healthy lifestyle.  Working with a diet and nutrition specialist (dietitian) can help you make a meal plan that is best for you.  Keep in mind that carbohydrates (carbs) and alcohol have immediate effects on your blood glucose levels. It is important to count carbs and to use alcohol carefully. This information is not intended to replace advice given to you by your health care provider. Make sure you discuss any questions you have with your health care provider. Document Released: 03/15/2005 Document Revised: 05/31/2017 Document Reviewed: 07/23/2016 Elsevier Patient Education  2020 Elsevier Inc.  

## 2019-05-22 NOTE — Progress Notes (Signed)
Patient was educated on the use of the Lantus Solostar pen. Reviewed necessary supplies and operation of the pen. Also reviewed goal blood glucose levels. Patient was able to demonstrate use. All questions and concerns were addressed.

## 2019-05-22 NOTE — Progress Notes (Signed)
Patient ID: Donald Carter, male    DOB: January 27, 1976  MRN: [id number]  CC: New Patient (Initial Visit), Diabetes, and Hypertension   Subjective: Donald Carter is a 43 y.o. male who presents for new pt visit.   His concerns today include:  Patient with history of HTN, DM type II, obesity   No previous PCP.  Dx with DM and HTN through ER this yr  HYPERTENSION Currently taking: Was prescribed amlodipine through the emergency room. Med Adherence: _0  Yes    _1  No - out Amlodipine 1-2 mths Medication side effects: _2  Yes  - made him urinate a lot   Adherence with salt restriction: _3  Yes    _4  No Home Monitoring?: _5  Yes    _6  No Monitoring Frequency: _7  Yes    _8  No Home BP results range: _9  Yes    _10  No SOB? _11  Yes    _12  No Chest Pain?: _13  Yes    _14  No Leg swelling?: _15  Yes    _16  No Headaches?: _17  Yes    _18  No Dizziness? _19  Yes    _20  No Comments:   DM: has device but no stripes No polyuria/polydipsia/blurred vision.  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 Drink coffee and tea with sugar There are no active problems to display for this patient.   Past medical, surgical, social, family history reviewed and updated. No current outpatient medications on file prior to visit.   No current facility-administered medications on file prior to visit.     No Known Allergies  Social History   Socioeconomic History  . Marital status: Single    Spouse name: Not on file  . Number of children: Not on file  . Years of education: 10 grade  . Highest education level: Not on file  Occupational History  . Occupation: deliver driver  Social Needs  . Financial resource strain: Not on file  . Food insecurity    Worry: Not on file    Inability: Not on file  . Transportation needs    Medical: Not on file    Non-medical: Not on file  Tobacco  Use  . Smoking status: Never Smoker  . Smokeless tobacco: Never Used  Substance and Sexual Activity  . Alcohol use: Yes    Comment: ocasional  . Drug use: No  . Sexual activity: Not on file  Lifestyle  . Physical activity    Days per week: Not on file    Minutes per session: Not on file  . Stress: Not on file  Relationships  . Social Herbalist on phone: Not on file    Gets together: Not on file    Attends religious service: Not on file    Active member of club or organization: Not on file    Attends meetings of clubs or organizations: Not on file    Relationship status: Not on file  . Intimate partner violence    Fear of current or ex partner: Not on file    Emotionally abused: Not on file    Physically abused: Not on file    Forced sexual activity: Not on file  Other Topics Concern  . Not on file  Social History Narrative  . Not on file    Family History  Problem Relation Age of Onset  .  Diabetes Mother   . Diabetes Father     Past Surgical History:  Procedure Laterality Date  . FINGER EXPLORATION     Rt index finger  . INCISION AND DRAINAGE Right 02/21/2017   Procedure: INCISION AND DRAINAGE WITH EXCISION PYOGENIC GRANULOMA RIGHT INDEX FINGER;  Surgeon: Leanora Cover, MD;  Location: [facility];  Service: Orthopedics;  Laterality: Right;  Bier block with MAC    ROS: Review of Systems Negative except as stated above  PHYSICAL EXAM: BP (!) 193/107   Pulse 87   Temp 98.6 F (37 C) (Oral)   Resp 16   Ht 6' (1.829 m)   Wt 231 lb 9.6 oz (105.1 kg)   SpO2 100%   BMI 31.41 kg/m   Physical Exam General appearance - alert, well appearing, middle-aged African-American male and in no distress Mental status - normal mood, behavior, speech, dress, motor activity, and thought processes Eyes - pupils equal and reactive, extraocular eye movements intact Nose - normal and patent, no erythema, discharge or polyps Mouth - mucous membranes moist,  pharynx normal without lesions Neck - supple, no significant adenopathy Chest - clear to auscultation, no wheezes, rales or rhonchi, symmetric air entry Heart - normal rate, regular rhythm, normal S1, S2, no murmurs, rubs, clicks or gallops Extremities - peripheral pulses normal, no pedal edema, no clubbing or cyanosis Diabetic Foot Exam - Simple   Simple Foot Form Visual Inspection No deformities, no ulcerations, no other skin breakdown bilaterally: Yes Sensation Testing Intact to touch and monofilament testing bilaterally: Yes Pulse Check Posterior Tibialis and Dorsalis pulse intact bilaterally: Yes Comments     A1C 12.3/BS 415  CMP Latest Ref Rng & Units 07/20/2018 02/06/2017 06/22/2016  Glucose 70 - 99 mg/dL 245(H) 340(H) 239(H)  BUN 6 - 20 mg/dL _0 Creatinine 0.61 - 1.24 mg/dL 1.22 1.07 1.20  Sodium 135 - 145 mmol/L 135 135 138  Potassium 3.5 - 5.1 mmol/L 3.7 3.9 4.4  Chloride 98 - 111 mmol/L 99 99(L) 101  CO2 22 - 32 mmol/L 25 24 -  Calcium 8.9 - 10.3 mg/dL 9.0 9.2 -   Lipid Panel  No results found for: CHOL, TRIG, HDL, CHOLHDL, VLDL, LDLCALC, LDLDIRECT  CBC    Component Value Date/Time   WBC 4.3 07/20/2018 1556   RBC 5.40 07/20/2018 1556   HGB 15.6 07/20/2018 1556   HCT 44.5 07/20/2018 1556   PLT 235 07/20/2018 1556   MCV 82.4 07/20/2018 1556   MCH 28.9 07/20/2018 1556   MCHC 35.1 07/20/2018 1556   RDW 12.1 07/20/2018 1556   LYMPHSABS 1.5 07/20/2018 1556   MONOABS 0.2 07/20/2018 1556   EOSABS 0.0 07/20/2018 1556   BASOSABS 0.0 07/20/2018 1556    ASSESSMENT AND PLAN: 1. Type 2 diabetes mellitus without complication, without long-term current use of insulin (HCC) Uncontrolled. I spent some time doing diabetic teaching including what his diabetes, manifestations, how is it managed and complications that can develop from uncontrolled diabetes.  Discussed the importance of healthy eating habits and regular exercise.  Dietary counseling given.  Printed  information provided.  Encouraged him to get in about 150 minutes/week of moderate intensity exercise. We will keep him on Metformin.  Advised starting Lantus insulin at bedtime.  Clinical pharmacist to do insulin teaching today.  Went over signs and symptoms of hypoglycemia and how to treated.  Encouraged him to check blood sugars at least twice a day before meals with goal being 90-130.  He  will bring in a log with him on his next visit in 1 month. - Glucose (CBG) - HgB A1c - Microalbumin/Creatinine Ratio, Urine - CBC - Comprehensive metabolic panel - Lipid panel - glucose blood (TRUE METRIX BLOOD GLUCOSE TEST) test strip; Use as instructed  Dispense: 100 each; Refill: 12 - Blood Glucose Monitoring Suppl (TRUE METRIX METER) w/Device KIT; Use as directed  Dispense: 1 kit; Refill: 0 - TRUEplus Lancets 28G MISC; Use as directed  Dispense: 100 each; Refill: 4 - metFORMIN (GLUCOPHAGE) 500 MG tablet; Take 1 tablet (500 mg total) by mouth 2 (two) times daily with a meal.  Dispense: 60 tablet; Refill: 5 - Insulin Glargine (LANTUS SOLOSTAR) 100 UNIT/ML Solostar Pen; Inject 12 Units into the skin daily.  Dispense: 5 pen; Refill: 11 - Insulin Pen Needle (PEN NEEDLES) 31G X 8 MM MISC; UAD  Dispense: 100 each; Refill: 6  2. Essential hypertension Not at goal.  Restart amlodipine.  Low-salt diet advised. - amLODipine (NORVASC) 10 MG tablet; Take 1 tablet (10 mg total) by mouth daily.  Dispense: 30 tablet; Refill: 3  3. Influenza vaccination declined Advised and encouraged and encouraged but patient declined  4. 23-polyvalent pneumococcal polysaccharide vaccine declined Advised and encouraged but patient declined     Patient was given the opportunity to ask questions.  Patient verbalized understanding of the plan and was able to repeat key elements of the plan.   Orders Placed This Encounter  Procedures  . Microalbumin/Creatinine Ratio, Urine  . CBC  . Comprehensive metabolic panel  . Lipid  panel  . Glucose (CBG)  . HgB A1c     Requested Prescriptions   Signed Prescriptions Disp Refills  . glucose blood (TRUE METRIX BLOOD GLUCOSE TEST) test strip 100 each 12    Sig: Use as instructed  . Blood Glucose Monitoring Suppl (TRUE METRIX METER) w/Device KIT 1 kit 0    Sig: Use as directed  . TRUEplus Lancets 28G MISC 100 each 4    Sig: Use as directed  . metFORMIN (GLUCOPHAGE) 500 MG tablet 60 tablet 5    Sig: Take 1 tablet (500 mg total) by mouth 2 (two) times daily with a meal.  . amLODipine (NORVASC) 10 MG tablet 30 tablet 3    Sig: Take 1 tablet (10 mg total) by mouth daily.  . Insulin Glargine (LANTUS SOLOSTAR) 100 UNIT/ML Solostar Pen 5 pen 11    Sig: Inject 12 Units into the skin daily.  . Insulin Pen Needle (PEN NEEDLES) 31G X 8 MM MISC 100 each 6    Sig: UAD    Return in about 4 weeks (around 06/19/2019).  Karle Plumber, MD, FACP

## 2019-05-23 LAB — LIPID PANEL
Chol/HDL Ratio: 5.4 ratio — ABNORMAL HIGH (ref 0.0–5.0)
Cholesterol, Total: 252 mg/dL — ABNORMAL HIGH (ref 100–199)
HDL: 47 mg/dL (ref >39)
LDL Chol Calc (NIH): 148 mg/dL — ABNORMAL HIGH (ref 0–99)
Triglycerides: 308 mg/dL — ABNORMAL HIGH (ref 0–149)
VLDL Cholesterol Cal: 57 mg/dL — ABNORMAL HIGH (ref 5–40)

## 2019-05-23 LAB — CBC
Hematocrit: 43.1 % (ref 37.5–51.0)
Hemoglobin: 15.1 g/dL (ref 13.0–17.7)
MCH: 29.3 pg (ref 26.6–33.0)
MCHC: 35 g/dL (ref 31.5–35.7)
MCV: 84 fL (ref 79–97)
Platelets: 228 10*3/uL (ref 150–450)
RBC: 5.16 x10E6/uL (ref 4.14–5.80)
RDW: 12.5 % (ref 11.6–15.4)
WBC: 4.6 10*3/uL (ref 3.4–10.8)

## 2019-05-23 LAB — COMPREHENSIVE METABOLIC PANEL
ALT: 26 IU/L (ref 0–44)
AST: 22 IU/L (ref 0–40)
Albumin/Globulin Ratio: 1.9 (ref 1.2–2.2)
Albumin: 4.6 g/dL (ref 4.0–5.0)
Alkaline Phosphatase: 71 IU/L (ref 39–117)
BUN/Creatinine Ratio: 8 — ABNORMAL LOW (ref 9–20)
BUN: 10 mg/dL (ref 6–24)
Bilirubin Total: 0.6 mg/dL (ref 0.0–1.2)
CO2: 22 mmol/L (ref 20–29)
Calcium: 9.7 mg/dL (ref 8.7–10.2)
Chloride: 96 mmol/L (ref 96–106)
Creatinine, Ser: 1.2 mg/dL (ref 0.76–1.27)
GFR calc Af Amer: 85 mL/min/{1.73_m2} (ref >59)
GFR calc non Af Amer: 73 mL/min/{1.73_m2} (ref >59)
Globulin, Total: 2.4 g/dL (ref 1.5–4.5)
Glucose: 378 mg/dL — ABNORMAL HIGH (ref 65–99)
Potassium: 4.2 mmol/L (ref 3.5–5.2)
Sodium: 137 mmol/L (ref 134–144)
Total Protein: 7 g/dL (ref 6.0–8.5)

## 2019-05-23 LAB — MICROALBUMIN / CREATININE URINE RATIO
Creatinine, Urine: 375.5 mg/dL
Microalb/Creat Ratio: 9 mg/g creat (ref 0–29)
Microalbumin, Urine: 32.5 ug/mL

## 2019-05-24 ENCOUNTER — Encounter: Payer: Self-pay | Admitting: Internal Medicine

## 2019-05-24 ENCOUNTER — Other Ambulatory Visit: Payer: Self-pay | Admitting: Internal Medicine

## 2019-05-24 DIAGNOSIS — E785 Hyperlipidemia, unspecified: Secondary | ICD-10-CM | POA: Insufficient documentation

## 2019-05-24 DIAGNOSIS — E1169 Type 2 diabetes mellitus with other specified complication: Secondary | ICD-10-CM | POA: Insufficient documentation

## 2019-05-24 MED ORDER — ATORVASTATIN CALCIUM 20 MG PO TABS: 20.0000 mg | ORAL_TABLET | Freq: Every day | ORAL | 3 refills | Status: DC

## 2019-05-25 MED FILL — ATORVASTATIN CALCIUM 20 MG: 20 | 30 days supply | Qty: 30 | Fill #0

## 2019-07-06 ENCOUNTER — Encounter: Payer: Self-pay | Admitting: Internal Medicine

## 2019-07-06 ENCOUNTER — Other Ambulatory Visit: Payer: Self-pay

## 2019-07-06 ENCOUNTER — Ambulatory Visit: Payer: Self-pay | Attending: Internal Medicine | Admitting: Internal Medicine

## 2019-07-06 VITALS — BP 140/98 | HR 93 | Resp 16 | Wt 226.6 lb

## 2019-07-06 DIAGNOSIS — E785 Hyperlipidemia, unspecified: Secondary | ICD-10-CM

## 2019-07-06 DIAGNOSIS — I10 Essential (primary) hypertension: Secondary | ICD-10-CM

## 2019-07-06 DIAGNOSIS — E119 Type 2 diabetes mellitus without complications: Secondary | ICD-10-CM

## 2019-07-06 DIAGNOSIS — E1169 Type 2 diabetes mellitus with other specified complication: Secondary | ICD-10-CM

## 2019-07-06 LAB — GLUCOSE, POCT (MANUAL RESULT ENTRY): POC Glucose: 256 mg/dl — AB (ref 70–99)

## 2019-07-06 MED ORDER — ATORVASTATIN CALCIUM 20 MG PO TABS: 20.0000 mg | ORAL_TABLET | Freq: Every day | ORAL | 3 refills | Status: DC

## 2019-07-06 MED ORDER — LANTUS SOLOSTAR 100 UNIT/ML ~~LOC~~ SOPN: 18.0000 [IU] | PEN_INJECTOR | Freq: Every day | SUBCUTANEOUS | 11 refills | Status: DC

## 2019-07-06 MED ORDER — METFORMIN HCL 1000 MG PO TABS: 1000.0000 mg | ORAL_TABLET | Freq: Two times a day (BID) | ORAL | 6 refills | Status: DC

## 2019-07-06 MED ORDER — LISINOPRIL 10 MG PO TABS: 10.0000 mg | ORAL_TABLET | Freq: Every day | ORAL | 1 refills | Status: DC

## 2019-07-06 MED FILL — metFORMIN HCL 1000 MG TABS: 1000 | 30 days supply | Qty: 60 | Fill #0

## 2019-07-06 MED FILL — ATORVASTATIN CALCIUM 20 MG: 20 | 30 days supply | Qty: 30 | Fill #0

## 2019-07-06 MED FILL — LISINOPRIL 10 MG TABS: 10 | 30 days supply | Qty: 30 | Fill #0

## 2019-07-06 MED FILL — !LANTUS SOLOSTAR 100UNITS/M: 100 | 32 days supply | Qty: 6 | Fill #0

## 2019-07-06 NOTE — Progress Notes (Signed)
Patient ID: MEIR ELWOOD, male    DOB: 05-Feb-1976  MRN: [id number]  CC: Diabetes and Hypertension   Subjective: Donald Carter is a 44 y.o. male who presents for 1 mth f/u DM His concerns today include:  Patient with history of HTN, DM type II, obesity   DM:  BS running higher han 225. Taking and tolerating Lantus12 units daily and Metformin 500 mg twice a day Diet: cut back on sugar and white carbs. eating more fruits - apples, banana, pears, strawberries and grapes.   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  Also does push ups at home.  He has lost about 5 pounds since last visit. No exam as yet.  No blurred vision.   No numbness or tingling in the hands or feet.  HL:  He was not able to access Mychart to get lab results and my recommendation to start atorvastatin.  His total and LDL cholesterols were elevated.  He is open to starting Lipitor.  HTN: Reports compliance with amlodipine and salt restriction.  Denies any chest pains, shortness of breath, lower extremity edema  Patient Active Problem List   Diagnosis Date Noted  . Hyperlipidemia associated with type 2 diabetes mellitus ([place]) 05/24/2019  . Type 2 diabetes mellitus without complication, without long-term current use of insulin ([place]) 05/22/2019  . Essential hypertension 05/22/2019     Current Outpatient Medications on File Prior to Visit  Medication Sig Dispense Refill  . amLODipine (NORVASC) 10 MG tablet Take 1 tablet (10 mg total) by mouth daily. 30 tablet 3  . Blood Glucose Monitoring Suppl (TRUE METRIX METER) w/Device KIT Use as directed 1 kit 0  . glucose blood (TRUE METRIX BLOOD GLUCOSE TEST) test strip Use as instructed 100 each 12  . Insulin Pen Needle (PEN NEEDLES) 31G X 8 MM MISC UAD 100 each 6  . TRUEplus Lancets 28G MISC Use as directed 100 each 4   No current facility-administered medications on file prior to visit.    No  Known Allergies  Social History   Socioeconomic History  . Marital status: Single    Spouse name: Not on file  . Number of children: Not on file  . Years of education: 10 grade  . Highest education level: Not on file  Occupational History  . Occupation: deliver driver  Tobacco Use  . Smoking status: Never Smoker  . Smokeless tobacco: Never Used  Substance and Sexual Activity  . Alcohol use: Yes    Comment: ocasional  . Drug use: No  . Sexual activity: Not on file  Other Topics Concern  . Not on file  Social History Narrative  . Not on file   Social Determinants of Health   Financial Resource Strain:   . Difficulty of Paying Living Expenses: Not on file  Food Insecurity:   . Worried About Charity fundraiser in the Last Year: Not on file  . Ran Out of Food in the Last Year: Not on file  Transportation Needs:   . Lack of Transportation (Medical): Not on file  . Lack of Transportation (Non-Medical): Not on file  Physical Activity:   . Days of Exercise per Week: Not on file  . Minutes of Exercise per Session: Not on file  Stress:   . Feeling of Stress : Not on file  Social Connections:   . Frequency of Communication with Friends and Family:  Not on file  . Frequency of Social Gatherings with Friends and Family: Not on file  . Attends Religious Services: Not on file  . Active Member of Clubs or Organizations: Not on file  . Attends Archivist Meetings: Not on file  . Marital Status: Not on file  Intimate Partner Violence:   . Fear of Current or Ex-Partner: Not on file  . Emotionally Abused: Not on file  . Physically Abused: Not on file  . Sexually Abused: Not on file    Family History  Problem Relation Age of Onset  . Diabetes Mother   . Diabetes Father     Past Surgical History:  Procedure Laterality Date  . FINGER EXPLORATION     Rt index finger  . INCISION AND DRAINAGE Right 02/21/2017   Procedure: INCISION AND DRAINAGE WITH EXCISION PYOGENIC  GRANULOMA RIGHT INDEX FINGER;  Surgeon: Leanora Cover, MD;  Location: [facility];  Service: Orthopedics;  Laterality: Right;  Bier block with MAC    ROS: Review of Systems Negative except as stated above  PHYSICAL EXAM: BP (!) 140/98   Pulse 93   Resp 16   Wt 226 lb 9.6 oz (102.8 kg)   SpO2 100%   BMI 30.73 kg/m   Wt Readings from Last 3 Encounters:  07/06/19 226 lb 9.6 oz (102.8 kg)  05/22/19 231 lb 9.6 oz (105.1 kg)  07/20/18 224 lb (101.6 kg)    Physical Exam  General appearance - alert, well appearing, middle-aged African-American male and in no distress Mental status - normal mood, behavior, speech, dress, motor activity, and thought processes Mouth - mucous membranes moist, pharynx normal without lesions Neck - supple, no significant adenopathy Chest - clear to auscultation, no wheezes, rales or rhonchi, symmetric air entry Heart - normal rate, regular rhythm, normal S1, S2, no murmurs, rubs, clicks or gallops Extremities - peripheral pulses normal, no pedal edema, no clubbing or cyanosis Diabetic Foot Exam - Simple   Simple Foot Form Visual Inspection See comments: Yes Sensation Testing Intact to touch and monofilament testing bilaterally: Yes Pulse Check Posterior Tibialis and Dorsalis pulse intact bilaterally: Yes Comments Patient is flat-footed.  Toenails are slightly overgrown.  Skin is dry around plantar surface of feet     CMP Latest Ref Rng & Units 05/22/2019 07/20/2018 02/06/2017  Glucose 65 - 99 mg/dL 378(H) 245(H) 340(H)  BUN 6 - 24 mg/dL _0 Creatinine 0.76 - 1.27 mg/dL 1.20 1.22 1.07  Sodium 134 - 144 mmol/L 137 135 135  Potassium 3.5 - 5.2 mmol/L 4.2 3.7 3.9  Chloride 96 - 106 mmol/L 96 99 99(L)  CO2 20 - 29 mmol/L _1 Calcium 8.7 - 10.2 mg/dL 9.7 9.0 9.2  Total Protein 6.0 - 8.5 g/dL 7.0 - -  Total Bilirubin 0.0 - 1.2 mg/dL 0.6 - -  Alkaline Phos 39 - 117 IU/L 71 - -  AST 0 - 40 IU/L 22 - -  ALT 0 - 44 IU/L 26 - -    Lipid Panel     Component Value Date/Time   CHOL 252 (H) 05/22/2019 0953   TRIG 308 (H) 05/22/2019 0953   HDL 47 05/22/2019 0953   CHOLHDL 5.4 (H) 05/22/2019 0953   LDLCALC 148 (H) 05/22/2019 0953    CBC    Component Value Date/Time   WBC 4.6 05/22/2019 0953   WBC 4.3 07/20/2018 1556   RBC 5.16 05/22/2019 0953   RBC 5.40 07/20/2018 1556  HGB 15.1 05/22/2019 0953   HCT 43.1 05/22/2019 0953   PLT 228 05/22/2019 0953   MCV 84 05/22/2019 0953   MCH 29.3 05/22/2019 0953   MCH 28.9 07/20/2018 1556   MCHC 35.0 05/22/2019 0953   MCHC 35.1 07/20/2018 1556   RDW 12.5 05/22/2019 0953   LYMPHSABS 1.5 07/20/2018 1556   MONOABS 0.2 07/20/2018 1556   EOSABS 0.0 07/20/2018 1556   BASOSABS 0.0 07/20/2018 1556    ASSESSMENT AND PLAN: 1. Type 2 diabetes mellitus without complication, without long-term current use of insulin ([place]) Reported blood sugars are not at goal.  I recommend increasing Lantus to 18 units daily and Metformin 1000 mg twice a day.  Dietary counseling given.  Commended him on changes that he has made so far.  Went over portion sizes in regards to fruits and encouraged him to cut out fried foods. Advised him to get in about 150 minutes/week of moderate intensity exercise - Glucose (CBG) - metFORMIN (GLUCOPHAGE) 1000 MG tablet; Take 1 tablet (1,000 mg total) by mouth 2 (two) times daily with a meal.  Dispense: 60 tablet; Refill: 6 - Insulin Glargine (LANTUS SOLOSTAR) 100 UNIT/ML Solostar Pen; Inject 18 Units into the skin daily.  Dispense: 5 pen; Refill: 11  2. Essential hypertension Not at goal.  Continue amlodipine.  Add Norvasc. - lisinopril (ZESTRIL) 10 MG tablet; Take 1 tablet (10 mg total) by mouth daily.  Dispense: 90 tablet; Refill: 1  3. Hyperlipidemia associated with type 2 diabetes mellitus ([place]) Discuss hyperlipidemia and its connection to heart disease and other cardiovascular events.  Discussed starting statin therapy in the form of Lipitor.  I went over  how medication works.  He is agreeable to starting Lipitor.  LFTs were normal. - atorvastatin (LIPITOR) 20 MG tablet; Take 1 tablet (20 mg total) by mouth daily.  Dispense: 90 tablet; Refill: 3     Patient was given the opportunity to ask questions.  Patient verbalized understanding of the plan and was able to repeat key elements of the plan.   Orders Placed This Encounter  Procedures  . Glucose (CBG)     Requested Prescriptions   Signed Prescriptions Disp Refills  . metFORMIN (GLUCOPHAGE) 1000 MG tablet 60 tablet 6    Sig: Take 1 tablet (1,000 mg total) by mouth 2 (two) times daily with a meal.  . Insulin Glargine (LANTUS SOLOSTAR) 100 UNIT/ML Solostar Pen 5 pen 11    Sig: Inject 18 Units into the skin daily.  Marland Kitchen lisinopril (ZESTRIL) 10 MG tablet 90 tablet 1    Sig: Take 1 tablet (10 mg total) by mouth daily.  Marland Kitchen atorvastatin (LIPITOR) 20 MG tablet 90 tablet 3    Sig: Take 1 tablet (20 mg total) by mouth daily.    Return in about 3 months (around 10/04/2019).  Karle Plumber, MD, FACP

## 2019-07-06 NOTE — Patient Instructions (Signed)
Your diabetes is not at goal.  I recommend increasing Lantus to 18 units daily and Metformin to 1000 mg twice a day.  Please try to schedule an eye exam.  Your blood pressure is not at goal.  We have added a blood pressure medication called lisinopril 10 mg once a day.  Your cholesterol is not at goal.  We have added a medication called atorvastatin.   Diabetes Mellitus and Nutrition, Adult When you have diabetes (diabetes mellitus), it is very important to have healthy eating habits because your blood sugar (glucose) levels are greatly affected by what you eat and drink. Eating healthy foods in the appropriate amounts, at about the same times every day, can help you:  Control your blood glucose.  Lower your risk of heart disease.  Improve your blood pressure.  Reach or maintain a healthy weight. Every person with diabetes is different, and each person has different needs for a meal plan. Your health care provider may recommend that you work with a diet and nutrition specialist (dietitian) to make a meal plan that is best for you. Your meal plan may vary depending on factors such as:  The calories you need.  The medicines you take.  Your weight.  Your blood glucose, blood pressure, and cholesterol levels.  Your activity level.  Other health conditions you have, such as heart or kidney disease. How do carbohydrates affect me? Carbohydrates, also called carbs, affect your blood glucose level more than any other type of food. Eating carbs naturally raises the amount of glucose in your blood. Carb counting is a method for keeping track of how many carbs you eat. Counting carbs is important to keep your blood glucose at a healthy level, especially if you use insulin or take certain oral diabetes medicines. It is important to know how many carbs you can safely have in each meal. This is different for every person. Your dietitian can help you calculate how many carbs you should have at each  meal and for each snack. Foods that contain carbs include:  Bread, cereal, rice, pasta, and crackers.  Potatoes and corn.  Peas, beans, and lentils.  Milk and yogurt.  Fruit and juice.  Desserts, such as cakes, cookies, ice cream, and candy. How does alcohol affect me? Alcohol can cause a sudden decrease in blood glucose (hypoglycemia), especially if you use insulin or take certain oral diabetes medicines. Hypoglycemia can be a life-threatening condition. Symptoms of hypoglycemia (sleepiness, dizziness, and confusion) are similar to symptoms of having too much alcohol. If your health care provider says that alcohol is safe for you, follow these guidelines:  Limit alcohol intake to no more than 1 drink per day for nonpregnant women and 2 drinks per day for men. One drink equals 12 oz of beer, 5 oz of wine, or 1 oz of hard liquor.  Do not drink on an empty stomach.  Keep yourself hydrated with water, diet soda, or unsweetened iced tea.  Keep in mind that regular soda, juice, and other mixers may contain a lot of sugar and must be counted as carbs. What are tips for following this plan?  Reading food labels  Start by checking the serving size on the "Nutrition Facts" label of packaged foods and drinks. The amount of calories, carbs, fats, and other nutrients listed on the label is based on one serving of the item. Many items contain more than one serving per package.  Check the total grams (g) of carbs in one  serving. You can calculate the number of servings of carbs in one serving by dividing the total carbs by 15. For example, if a food has 30 g of total carbs, it would be equal to 2 servings of carbs.  Check the number of grams (g) of saturated and trans fats in one serving. Choose foods that have low or no amount of these fats.  Check the number of milligrams (mg) of salt (sodium) in one serving. Most people should limit total sodium intake to less than 2,300 mg per  day.  Always check the nutrition information of foods labeled as "low-fat" or "nonfat". These foods may be higher in added sugar or refined carbs and should be avoided.  Talk to your dietitian to identify your daily goals for nutrients listed on the label. Shopping  Avoid buying canned, premade, or processed foods. These foods tend to be high in fat, sodium, and added sugar.  Shop around the outside edge of the grocery store. This includes fresh fruits and vegetables, bulk grains, fresh meats, and fresh dairy. Cooking  Use low-heat cooking methods, such as baking, instead of high-heat cooking methods like deep frying.  Cook using healthy oils, such as olive, canola, or sunflower oil.  Avoid cooking with butter, cream, or high-fat meats. Meal planning  Eat meals and snacks regularly, preferably at the same times every day. Avoid going long periods of time without eating.  Eat foods high in fiber, such as fresh fruits, vegetables, beans, and whole grains. Talk to your dietitian about how many servings of carbs you can eat at each meal.  Eat 4-6 ounces (oz) of lean protein each day, such as lean meat, chicken, fish, eggs, or tofu. One oz of lean protein is equal to: ? 1 oz of meat, chicken, or fish. ? 1 egg. ?  cup of tofu.  Eat some foods each day that contain healthy fats, such as avocado, nuts, seeds, and fish. Lifestyle  Check your blood glucose regularly.  Exercise regularly as told by your health care provider. This may include: ? 150 minutes of moderate-intensity or vigorous-intensity exercise each week. This could be brisk walking, biking, or water aerobics. ? Stretching and doing strength exercises, such as yoga or weightlifting, at least 2 times a week.  Take medicines as told by your health care provider.  Do not use any products that contain nicotine or tobacco, such as cigarettes and e-cigarettes. If you need help quitting, ask your health care provider.  Work with  a Social worker or diabetes educator to identify strategies to manage stress and any emotional and social challenges. Questions to ask a health care provider  Do I need to meet with a diabetes educator?  Do I need to meet with a dietitian?  What number can I call if I have questions?  When are the best times to check my blood glucose? Where to find more information:  American Diabetes Association: diabetes.org  Academy of Nutrition and Dietetics: www.eatright.[employer] of Diabetes and Digestive and Kidney Diseases (NIH): [employer] Summary  A healthy meal plan will help you control your blood glucose and maintain a healthy lifestyle.  Working with a diet and nutrition specialist (dietitian) can help you make a meal plan that is best for you.  Keep in mind that carbohydrates (carbs) and alcohol have immediate effects on your blood glucose levels. It is important to count carbs and to use alcohol carefully. This information is not intended to replace advice given to  you by your health care provider. Make sure you discuss any questions you have with your health care provider. Document Revised: 05/31/2017 Document Reviewed: 07/23/2016 Elsevier Patient Education  2020 [employer].

## 2019-07-06 NOTE — Addendum Note (Signed)
Addended by: Carolynne Edouard R on: 07/06/2019 04:08 PM   Modules accepted: Orders

## 2019-11-18 MED FILL — AMLODIPINE BESYLATE 10 MG T: 10 | 30 days supply | Qty: 30 | Fill #0

## 2019-12-30 ENCOUNTER — Other Ambulatory Visit: Payer: Self-pay | Admitting: Internal Medicine

## 2019-12-30 DIAGNOSIS — I10 Essential (primary) hypertension: Secondary | ICD-10-CM

## 2019-12-30 MED ORDER — AMLODIPINE BESYLATE 10 MG PO TABS: 10.0000 mg | ORAL_TABLET | Freq: Every day | ORAL | 1 refills | Status: DC

## 2019-12-30 MED FILL — AMLODIPINE BESYLATE 10 MG T: 10 | 30 days supply | Qty: 30 | Fill #0

## 2019-12-30 NOTE — Telephone Encounter (Signed)
Copied from CRM [id number]. Topic: Quick Communication - Rx Refill/Question >> Dec 30, 2019  2:45 PM Jaquita Rector A wrote: Medication: amLODipine (NORVASC) 10 MG tablet   Has the patient contacted their pharmacy? Yes.   (Agent: If no, request that the patient contact the pharmacy for the refill.) (Agent: If yes, when and what did the pharmacy advise?)  Preferred Pharmacy (with phone number or street name): [facility] - [place], [place] - [place] [address]  Phone:  [phone] Fax:  [phone]     Agent: Please be advised that RX refills may take up to 3 business days. We ask that you follow-up with your pharmacy.

## 2020-01-29 ENCOUNTER — Other Ambulatory Visit: Payer: Self-pay | Admitting: Internal Medicine

## 2020-01-29 DIAGNOSIS — I10 Essential (primary) hypertension: Secondary | ICD-10-CM

## 2020-01-29 NOTE — Telephone Encounter (Signed)
Medication Refill - Medication: amLODipine (NORVASC) 10 MG tablet [[id number]]     Preferred Pharmacy (with phone number or street name):  [facility] - [place], [place] - [place] [address]  [address] [place] [address]  Phone: [phone] Fax: [phone]     Agent: Please be advised that RX refills may take up to 3 business days. We ask that you follow-up with your pharmacy.

## 2020-01-29 NOTE — Telephone Encounter (Signed)
Left a vm for patient to callback. Medication was filled on 12/30/2019 with 90 day refill.

## 2020-02-03 MED FILL — AMLODIPINE BESYLATE 10 MG T: 10 | 30 days supply | Qty: 30 | Fill #1

## 2020-03-23 MED FILL — metFORMIN HCL 1000 MG TABS: 1000 | 30 days supply | Qty: 60 | Fill #1

## 2020-03-23 MED FILL — AMLODIPINE BESYLATE 10 MG T: 10 | 30 days supply | Qty: 30 | Fill #2

## 2020-07-15 ENCOUNTER — Other Ambulatory Visit: Payer: Self-pay | Admitting: Internal Medicine

## 2020-07-15 DIAGNOSIS — E119 Type 2 diabetes mellitus without complications: Secondary | ICD-10-CM

## 2020-07-15 MED FILL — $LANTUS SOLOSTAR 100 UNITS/: 100 | 33 days supply | Qty: 6 | Fill #0

## 2020-07-15 MED FILL — METFORMIN HCL 1000 MG TABS: 1000 | 30 days supply | Qty: 60 | Fill #0

## 2020-07-15 MED FILL — AMLODIPINE BESYLATE 10 MG T: 10 | 30 days supply | Qty: 30 | Fill #3

## 2020-07-15 NOTE — Telephone Encounter (Signed)
30 day supply of metformin and Lantus sent to pharmacy.  Patient needs appointment for further refills.  LOV 07-06-2019

## 2020-08-26 ENCOUNTER — Other Ambulatory Visit: Payer: Self-pay

## 2020-08-26 ENCOUNTER — Ambulatory Visit: Payer: Self-pay | Attending: Internal Medicine | Admitting: Internal Medicine

## 2020-08-26 DIAGNOSIS — Z5329 Procedure and treatment not carried out because of patient's decision for other reasons: Secondary | ICD-10-CM

## 2020-08-26 DIAGNOSIS — Z91199 Patient's noncompliance with other medical treatment and regimen due to unspecified reason: Secondary | ICD-10-CM

## 2020-08-28 NOTE — Progress Notes (Signed)
No show for virtual/telephone appt

## 2020-12-14 ENCOUNTER — Other Ambulatory Visit: Payer: Self-pay

## 2022-05-25 ENCOUNTER — Other Ambulatory Visit: Payer: Self-pay

## 2024-07-17 ENCOUNTER — Observation Stay (HOSPITAL_COMMUNITY)
Admission: EM | Admit: 2024-07-17 | Discharge: 2024-07-19 | Disposition: A | Attending: Emergency Medicine | Admitting: Emergency Medicine

## 2024-07-17 ENCOUNTER — Encounter (HOSPITAL_COMMUNITY): Payer: Self-pay

## 2024-07-17 ENCOUNTER — Emergency Department (HOSPITAL_COMMUNITY)

## 2024-07-17 ENCOUNTER — Other Ambulatory Visit: Payer: Self-pay

## 2024-07-17 DIAGNOSIS — I63312 Cerebral infarction due to thrombosis of left middle cerebral artery: Secondary | ICD-10-CM

## 2024-07-17 DIAGNOSIS — I10 Essential (primary) hypertension: Secondary | ICD-10-CM | POA: Diagnosis not present

## 2024-07-17 DIAGNOSIS — I6381 Other cerebral infarction due to occlusion or stenosis of small artery: Secondary | ICD-10-CM | POA: Diagnosis not present

## 2024-07-17 DIAGNOSIS — H9192 Unspecified hearing loss, left ear: Secondary | ICD-10-CM | POA: Diagnosis present

## 2024-07-17 DIAGNOSIS — N2889 Other specified disorders of kidney and ureter: Secondary | ICD-10-CM | POA: Diagnosis not present

## 2024-07-17 DIAGNOSIS — I639 Cerebral infarction, unspecified: Secondary | ICD-10-CM | POA: Diagnosis not present

## 2024-07-17 DIAGNOSIS — E1169 Type 2 diabetes mellitus with other specified complication: Secondary | ICD-10-CM | POA: Diagnosis present

## 2024-07-17 DIAGNOSIS — H9042 Sensorineural hearing loss, unilateral, left ear, with unrestricted hearing on the contralateral side: Secondary | ICD-10-CM | POA: Diagnosis not present

## 2024-07-17 DIAGNOSIS — I6389 Other cerebral infarction: Principal | ICD-10-CM | POA: Insufficient documentation

## 2024-07-17 DIAGNOSIS — Z79899 Other long term (current) drug therapy: Secondary | ICD-10-CM | POA: Insufficient documentation

## 2024-07-17 DIAGNOSIS — R29700 NIHSS score 0: Secondary | ICD-10-CM

## 2024-07-17 DIAGNOSIS — E785 Hyperlipidemia, unspecified: Secondary | ICD-10-CM | POA: Diagnosis not present

## 2024-07-17 DIAGNOSIS — E1159 Type 2 diabetes mellitus with other circulatory complications: Secondary | ICD-10-CM | POA: Diagnosis present

## 2024-07-17 DIAGNOSIS — E1100 Type 2 diabetes mellitus with hyperosmolarity without nonketotic hyperglycemic-hyperosmolar coma (NKHHC): Secondary | ICD-10-CM

## 2024-07-17 DIAGNOSIS — H9312 Tinnitus, left ear: Secondary | ICD-10-CM | POA: Diagnosis not present

## 2024-07-17 DIAGNOSIS — E1165 Type 2 diabetes mellitus with hyperglycemia: Secondary | ICD-10-CM | POA: Diagnosis not present

## 2024-07-17 DIAGNOSIS — E119 Type 2 diabetes mellitus without complications: Secondary | ICD-10-CM

## 2024-07-17 LAB — URINALYSIS, ROUTINE W REFLEX MICROSCOPIC
Bacteria, UA: NONE SEEN
Bilirubin Urine: NEGATIVE
Glucose, UA: >=500 mg/dL — AB
Ketones, ur: NEGATIVE mg/dL
Leukocytes,Ua: NEGATIVE
Nitrite: NEGATIVE
Protein, ur: >=300 mg/dL — AB
Specific Gravity, Urine: 1.021 (ref 1.005–1.030)
pH: 5 (ref 5.0–8.0)

## 2024-07-17 LAB — CBC
HCT: 34.6 % — ABNORMAL LOW (ref 39.0–52.0)
Hemoglobin: 12.3 g/dL — ABNORMAL LOW (ref 13.0–17.0)
MCH: 28.5 pg (ref 26.0–34.0)
MCHC: 35.5 g/dL (ref 30.0–36.0)
MCV: 80.3 fL (ref 80.0–100.0)
Platelets: 274 K/uL (ref 150–400)
RBC: 4.31 MIL/uL (ref 4.22–5.81)
RDW: 12 % (ref 11.5–15.5)
WBC: 6.8 K/uL (ref 4.0–10.5)
nRBC: 0 % (ref 0.0–0.2)

## 2024-07-17 LAB — COMPREHENSIVE METABOLIC PANEL WITH GFR
ALT: 23 U/L (ref 0–44)
AST: 37 U/L (ref 15–41)
Albumin: 3.7 g/dL (ref 3.5–5.0)
Alkaline Phosphatase: 70 U/L (ref 38–126)
Anion gap: 10 (ref 5–15)
BUN: 17 mg/dL (ref 6–20)
CO2: 25 mmol/L (ref 22–32)
Calcium: 9.3 mg/dL (ref 8.9–10.3)
Chloride: 96 mmol/L — ABNORMAL LOW (ref 98–111)
Creatinine, Ser: 1.45 mg/dL — ABNORMAL HIGH (ref 0.61–1.24)
GFR, Estimated: 59 mL/min — ABNORMAL LOW
Glucose, Bld: 378 mg/dL — ABNORMAL HIGH (ref 70–99)
Potassium: 4.9 mmol/L (ref 3.5–5.1)
Sodium: 132 mmol/L — ABNORMAL LOW (ref 135–145)
Total Bilirubin: 0.4 mg/dL (ref 0.0–1.2)
Total Protein: 6.9 g/dL (ref 6.5–8.1)

## 2024-07-17 LAB — URINE DRUG SCREEN
Amphetamines: NEGATIVE
Barbiturates: NEGATIVE
Benzodiazepines: NEGATIVE
Cocaine: NEGATIVE
Fentanyl: NEGATIVE
Methadone Scn, Ur: NEGATIVE
Opiates: NEGATIVE
Tetrahydrocannabinol: NEGATIVE

## 2024-07-17 LAB — CBG MONITORING, ED: Glucose-Capillary: 430 mg/dL — ABNORMAL HIGH (ref 70–99)

## 2024-07-17 LAB — HIV ANTIBODY (ROUTINE TESTING W REFLEX): HIV Screen 4th Generation wRfx: NONREACTIVE

## 2024-07-17 MED ORDER — GADOBUTROL 1 MMOL/ML IV SOLN
10.0000 mL | Freq: Once | INTRAVENOUS | Status: AC | PRN
  Administered 2024-07-17: 10 mL via INTRAVENOUS

## 2024-07-17 MED ORDER — SENNOSIDES-DOCUSATE SODIUM 8.6-50 MG PO TABS: 1.0000 | ORAL_TABLET | Freq: Every evening | ORAL | Status: DC | PRN

## 2024-07-17 MED ORDER — ENOXAPARIN SODIUM 40 MG/0.4ML IJ SOSY
40.0000 mg | PREFILLED_SYRINGE | INTRAMUSCULAR | Status: DC
  Administered 2024-07-17 – 2024-07-18 (×2): 40 mg via SUBCUTANEOUS
  Filled 2024-07-17 (×2): qty 0.4

## 2024-07-17 MED ORDER — CLOPIDOGREL BISULFATE 75 MG PO TABS
75.0000 mg | ORAL_TABLET | Freq: Every day | ORAL | Status: DC
  Administered 2024-07-17 – 2024-07-19 (×3): 75 mg via ORAL
  Filled 2024-07-17 (×3): qty 1

## 2024-07-17 MED ORDER — ATORVASTATIN CALCIUM 40 MG PO TABS
40.0000 mg | ORAL_TABLET | Freq: Every day | ORAL | Status: DC
  Administered 2024-07-18 – 2024-07-19 (×2): 40 mg via ORAL
  Filled 2024-07-17 (×2): qty 1

## 2024-07-17 MED ORDER — ACETAMINOPHEN 160 MG/5ML PO SOLN: 650.0000 mg | ORAL | Status: DC | PRN

## 2024-07-17 MED ORDER — INSULIN ASPART 100 UNIT/ML IJ SOLN
0.0000 [IU] | Freq: Every day | INTRAMUSCULAR | Status: DC
  Administered 2024-07-18: 2 [IU] via SUBCUTANEOUS
  Filled 2024-07-17: qty 2

## 2024-07-17 MED ORDER — STROKE: EARLY STAGES OF RECOVERY BOOK
Freq: Once | Status: AC
  Filled 2024-07-17: qty 1

## 2024-07-17 MED ORDER — ASPIRIN 81 MG PO TBEC
81.0000 mg | DELAYED_RELEASE_TABLET | Freq: Every day | ORAL | Status: DC
  Administered 2024-07-17 – 2024-07-19 (×3): 81 mg via ORAL
  Filled 2024-07-17 (×3): qty 1

## 2024-07-17 MED ORDER — INSULIN ASPART 100 UNIT/ML IJ SOLN
0.0000 [IU] | Freq: Three times a day (TID) | INTRAMUSCULAR | Status: DC
  Administered 2024-07-18: 1 [IU] via SUBCUTANEOUS
  Administered 2024-07-18: 5 [IU] via SUBCUTANEOUS
  Administered 2024-07-18: 3 [IU] via SUBCUTANEOUS
  Administered 2024-07-19: 2 [IU] via SUBCUTANEOUS
  Filled 2024-07-17: qty 3
  Filled 2024-07-17: qty 5
  Filled 2024-07-17: qty 2
  Filled 2024-07-17: qty 1

## 2024-07-17 MED ORDER — INSULIN GLARGINE 100 UNIT/ML ~~LOC~~ SOLN
5.0000 [IU] | Freq: Every day | SUBCUTANEOUS | Status: DC
  Administered 2024-07-17: 5 [IU] via SUBCUTANEOUS
  Filled 2024-07-17 (×2): qty 0.05

## 2024-07-17 MED ORDER — HYDRALAZINE HCL 20 MG/ML IJ SOLN: 10.0000 mg | INTRAMUSCULAR | Status: DC | PRN

## 2024-07-17 MED ORDER — INSULIN ASPART 100 UNIT/ML IJ SOLN
6.0000 [IU] | Freq: Once | INTRAMUSCULAR | Status: AC
  Administered 2024-07-17: 6 [IU] via SUBCUTANEOUS
  Filled 2024-07-17: qty 6

## 2024-07-17 MED ORDER — ACETAMINOPHEN 325 MG PO TABS: 650.0000 mg | ORAL_TABLET | ORAL | Status: DC | PRN

## 2024-07-17 MED ORDER — ACETAMINOPHEN 650 MG RE SUPP: 650.0000 mg | RECTAL | Status: DC | PRN

## 2024-07-17 NOTE — ED Notes (Signed)
 Pt transported to CT at this time.

## 2024-07-17 NOTE — TOC CM/SW Note (Signed)
 TOC consult received for pcp information. Patient has [employer] coverage. CM located list of primary care providers on the [employer] website and printed patient a short list of ones near his home. Patient was also told to check his insurance card, in case he had already been assigned a provider and to contact [employer] member services should he have further questions.   No additional CM needs reported at this time.   Merilee Batty, MSN, RN Case Management [phone]

## 2024-07-17 NOTE — ED Triage Notes (Signed)
 Pt reports hearing loss to his left ear that started yesterday morning, reports ringing and pressure to left ear.

## 2024-07-17 NOTE — Hospital Course (Signed)
 Donald Carter is a 49 y.o. male with medical history significant for T2DM, HTN, HLD who presented with acute left-sided hearing loss and is admitted for CVA workup.

## 2024-07-17 NOTE — ED Provider Notes (Signed)
 " [place] EMERGENCY DEPARTMENT AT [facility] Provider Note   CSN: [id number] Arrival date & time: 07/17/24  1114     Patient presents with: Ringing/Pressure Lt ear   Donald Carter is a 49 y.o. male.   HPI Patient with hearing loss.  Denies substantial medical problems diabetes, hypertension.  He has been completely controlled after discovering that he noticed left hearing loss.  There was mild burning sensation which improved improved, patient still has no hearing in the left ear. No other weakness, speech difficulty, vision change, pain. On secondary conversation the patient notes that he has had some left inferior oral numbness, this has been present for months.    Prior to Admission medications  Medication Sig Start Date End Date Taking? Authorizing Provider  amLODipine  (NORVASC ) 10 MG tablet TAKE 1 TABLET (10 MG TOTAL) BY MOUTH DAILY. 12/30/19 12/29/20  Vicci Barnie NOVAK, MD  atorvastatin  (LIPITOR) 20 MG tablet Take 1 tablet (20 mg total) by mouth daily. 07/06/19   Vicci Barnie NOVAK, MD  Blood Glucose Monitoring Suppl (TRUE METRIX METER) w/Device KIT Use as directed 05/22/19   Vicci Barnie NOVAK, MD  glucose blood (TRUE METRIX BLOOD GLUCOSE TEST) test strip Use as instructed 05/22/19   Vicci Barnie NOVAK, MD  Insulin  Pen Needle (PEN NEEDLES) 31G X 8 MM MISC UAD 05/22/19   Vicci Barnie NOVAK, MD  LANTUS  SOLOSTAR 100 UNIT/ML Solostar Pen INJECT 18 UNITS INTO THE SKIN DAILY. 07/15/20   Vicci Barnie NOVAK, MD  lisinopril  (ZESTRIL ) 10 MG tablet Take 1 tablet (10 mg total) by mouth daily. 07/06/19   Vicci Barnie NOVAK, MD  metFORMIN  (GLUCOPHAGE ) 1000 MG tablet TAKE 1 TABLET (1,000 MG TOTAL) BY MOUTH 2 (TWO) TIMES DAILY WITH A MEAL. 07/15/20 07/15/21  Vicci Barnie NOVAK, MD  TRUEplus Lancets 28G MISC Use as directed 05/22/19   Vicci Barnie NOVAK, MD    Allergies: Patient has no known allergies.    Review of Systems  Updated Vital Signs BP (!) 139/97   Pulse 75   Temp 98  F (36.7 C)   Resp 17   Ht 1.829 m (6')   SpO2 100%   BMI 30.73 kg/m   Physical Exam Vitals and nursing note reviewed.  Constitutional:      General: He is not in acute distress.    Appearance: He is well-developed.  HENT:     Head: Normocephalic and atraumatic.     Ears:   Eyes:     Conjunctiva/sclera: Conjunctivae normal.  Cardiovascular:     Rate and Rhythm: Normal rate and regular rhythm.  Pulmonary:     Effort: Pulmonary effort is normal. No respiratory distress.     Breath sounds: No stridor.  Abdominal:     General: There is no distension.  Skin:    General: Skin is warm and dry.  Neurological:     Mental Status: He is alert and oriented to person, place, and time.     Cranial Nerves: Cranial nerve deficit present. No dysarthria or facial asymmetry.     Motor: No weakness, tremor, atrophy, abnormal muscle tone, seizure activity or pronator drift.     Comments: Isolated left hearing loss     (all labs ordered are listed, but only abnormal results are displayed) Labs Reviewed  COMPREHENSIVE METABOLIC PANEL WITH GFR - Abnormal; Notable for the following components:      Result Value   Sodium 132 (*)    Chloride 96 (*)    Glucose,  Bld 378 (*)    Creatinine, Ser 1.45 (*)    GFR, Estimated 59 (*)    All other components within normal limits  CBC - Abnormal; Notable for the following components:   Hemoglobin 12.3 (*)    HCT 34.6 (*)    All other components within normal limits  URINALYSIS, ROUTINE W REFLEX MICROSCOPIC - Abnormal; Notable for the following components:   APPearance HAZY (*)    Glucose, UA >=500 (*)    Hgb urine dipstick SMALL (*)    Protein, ur >=300 (*)    All other components within normal limits  URINE DRUG SCREEN  HIV ANTIBODY (ROUTINE TESTING W REFLEX)  SYPHILIS: RPR W/REFLEX TO RPR TITER AND TREPONEMAL ANTIBODIES, TRADITIONAL SCREENING AND DIAGNOSIS ALGORITHM    EKG: None  Radiology: MR BRAIN/IAC W WO CONTRAST Result Date:  07/17/2024 EXAM: MR Internal Auditory Canals without and with Contrast 07/17/2024 03:11:00 PM TECHNIQUE: Multiplanar, multisequence MRI of the internal auditory canals was performed without and with administration of intravenous contrast. COMPARISON: MRI HEAD from 06/22/2016. CLINICAL HISTORY: Hearing loss ICD-10 Code: 357797 FINDINGS: INTERNAL AUDITORY CANALS: No mass or abnormal enhancement. Inner ear structures are unremarkable. MASTOIDS AND MIDDLE EARS: Clear. BRAIN AND BRAINSTEM: Two small foci of restricted diffusion in the left basal ganglia including an area with peripheral enhancement for example, c-series 3, images 21 and 28 and series 14, image 76. Mild edema without mass effect. Additional punctate focus of enhancement in the right basal ganglia (series 14 image 70) without associated restricted diffusion. No edema there. Remote right thalamic lacunar infarct. SINUSES: No acute abnormality. BONES AND SOFT TISSUES: Normal bone marrow signal. IMPRESSION: 1. Two small foci of restricted diffusion in the left basal ganglia including an area with peripheral enhancement, most likely subacute enhancing perforator infarcts. 2. Additional punctate focus of enhancement in the right basal ganglia without associated restricted diffusion. While nonspecific, given the above findings this most likely represents an additional enhancing subacute infarct. The distribution and appearance is atypical for demyelination or metastases. Atypical infection (such as cryptococcosis) is also less likely but should be considered if the patient is immunocompromised. 3. A short-interval follow up MRI head with contrast in 4-6 weeks is recommended to help ensure expected evolution of the above findings. 4. No retrocochlear mass. 5. Remote right thalamic lacunar infarct. Electronically signed by: Glendia Molt MD 07/17/2024 03:32 PM EST RP Workstation: [id number]   CT Head Wo Contrast Result Date: 07/17/2024 EXAM: CT HEAD WITHOUT  CONTRAST 07/17/2024 01:10:52 PM TECHNIQUE: CT of the head was performed without the administration of intravenous contrast. Automated exposure control, iterative reconstruction, and/or weight based adjustment of the mA/kV was utilized to reduce the radiation dose to as low as reasonably achievable. COMPARISON: None available. CLINICAL HISTORY: Neuro deficit, acute, stroke suspected; acute L hearing loss FINDINGS: BRAIN AND VENTRICLES: No acute hemorrhage. No evidence of acute infarct. No hydrocephalus. No extra-axial collection. No mass effect or midline shift. ORBITS: No acute abnormality. SINUSES: Right maxillary sinus mucosal thickening. SOFT TISSUES AND SKULL: No acute soft tissue abnormality. No skull fracture. IMPRESSION: 1. No acute intracranial abnormality. Electronically signed by: Ryan Chess MD 07/17/2024 01:16 PM EST RP Workstation: [id number]     Procedures   Medications Ordered in the ED  gadobutrol  (GADAVIST ) 1 MMOL/ML injection 10 mL (10 mLs Intravenous Contrast Given 07/17/24 1459)    Clinical Course as of 07/17/24 1633  Fri Jul 17, 2024  1622 Assumed care from Dr Garrick. 49 yo M who presented with  hearing loss out of his left ear yesterday. Had MRI today and appears to have possible subacute strokes that are incidental. Neuro consulted. Otoscopic exam normal. Awaiting neuro recs now.  [RP]    Clinical Course User Index [RP] Yolande Lamar BROCKS, MD                                 Medical Decision Making Well-appearing adult male presents with new isolated left hearing loss without obvious mechanical cause with unremarkable TM, no pharyngitis or sore throat symptoms suggesting eustachian tube disruption.  Patient had head CT, labs, broad differential including mass, demyelination, infection, stroke  Amount and/or Complexity of Data Reviewed Labs: ordered. Decision-making details documented in ED Course. Radiology: ordered and independent interpretation performed.  Decision-making details documented in ED Course.  Risk Prescription drug management. Decision regarding hospitalization.   Update: Head CT without mass, with concern for ascites.  Also discussed case with our neurology team.  Patient will have MRI with, without, IAC protocol.  Update: Patient in no distress, continues to have no other complaints, we discussed MRI findings which are notable for multiple punctate abnormalities, left discussed this with our neurology colleagues who will see the patient in the emergency department.  Patient aware of neuro consult, on signout this is pending, with repeat evaluation as well.     Final diagnoses:  Acute hearing loss, left    ED Discharge Orders     None          Garrick Lamar, MD 07/17/24 1650  "

## 2024-07-17 NOTE — ED Triage Notes (Signed)
 Pt came in via POV d/t yesterday morning having a loud ringing in his Lt ear & then felt a pressure & then he cannot hear out of that ear since it happened. A/Ox4, denies any acute pain at this time.

## 2024-07-17 NOTE — ED Provider Notes (Signed)
" °  Physical Exam  BP 138/81 (BP Location: Right Arm)   Pulse 76   Temp 97.6 F (36.4 C) (Oral)   Resp 19   Ht 6' (1.829 m)   Wt 84.2 kg   SpO2 99%   BMI 25.18 kg/m   Physical Exam  Procedures  Procedures  ED Course / MDM   Clinical Course as of 07/18/24 1621  Fri Jul 17, 2024  1622 Assumed care from Dr Garrick. 49 yo M who presented with hearing loss out of his left ear yesterday. Had MRI today and appears to have possible subacute strokes that are incidental. Neuro consulted. Otoscopic exam normal. Awaiting neuro recs now.  [RP]  1836 Neuro recommends admission and additional imaging. Pt reassessed. No new symptoms. Discussed with Dr Tobie from [facility] for admission [RP]    Clinical Course User Index [RP] Yolande Lamar BROCKS, MD   Medical Decision Making Amount and/or Complexity of Data Reviewed Labs: ordered. Radiology: ordered.  Risk Prescription drug management. Decision regarding hospitalization.      Yolande Lamar BROCKS, MD 07/18/24 1621  "

## 2024-07-17 NOTE — H&P (Signed)
 " History and Physical    Donald Carter FMW:[id number] DOB: 06-May-1976 DOA: 07/17/2024  PCP: Vicci Barnie NOVAK, MD  Patient coming from: Home  I have personally briefly reviewed patient's old medical records in [facility]  Chief Complaint: Left hearing loss  HPI: Donald Carter is a 49 y.o. male with medical history significant for T2DM, HTN, HLD who presents to the ED for evaluation of left-sided hearing loss.  Patient reports that when he woke up yesterday morning he felt pressure and a loud ringing in his left ear.  He then realized that he was unable to hear out of his left ear but had normal hearing on the right.  He denied any associated headache, change in vision, nausea, vomiting, chest pain, palpitations, dyspnea, focal weakness.  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  He has not seen his primary care in about 4 years.  Has not been taking any medications.  ED Course  Labs/Imaging on admission: I have personally reviewed following labs and imaging studies.  Initial vitals showed BP 170/103, pulse 90, RR 18, temp 98.1 F, SpO2 100% on room air.  Labs showed sodium 132, potassium 4.9, bicarb 25, BUN 17, creatinine 1.45, serum glucose 378, LFTs within normal limits, WBC 6.8, hemoglobin 12.3, platelets 274.  UA negative for UTI.  UDS negative.  HIV antibody nonreactive.  CT head without contrast negative for acute intracranial abnormality.  MRI brain/IAC with and without contrast IMPRESSION: 1. Two small foci of restricted diffusion in the left basal ganglia including an area with peripheral enhancement, most likely subacute enhancing perforator infarcts. 2. Additional punctate focus of enhancement in the right basal ganglia without associated restricted diffusion. While nonspecific, given the above findings this most likely represents an additional enhancing subacute infarct. The distribution and appearance is atypical for demyelination or  metastases. Atypical infection (such as cryptococcosis) is also less likely but should be considered if the patient is immunocompromised. 3. A short-interval follow up MRI head with contrast in 4-6 weeks is recommended to help ensure expected evolution of the above findings. 4. No retrocochlear mass. 5. Remote right thalamic lacunar infarct.  Neurology were consulted and recommended medical admission for further CVA evaluation.  The hospitalist service was consulted for admission.  Review of Systems: All systems reviewed and are negative except as documented in history of present illness above.   Past Medical History:  Diagnosis Date   Diabetes mellitus without complication (HCC)    Hypertension     Past Surgical History:  Procedure Laterality Date   FINGER EXPLORATION     Rt index finger   INCISION AND DRAINAGE Right 02/21/2017   Procedure: INCISION AND DRAINAGE WITH EXCISION PYOGENIC GRANULOMA RIGHT INDEX FINGER;  Surgeon: Murrell Drivers, MD;  Location: [facility];  Service: Orthopedics;  Laterality: Right;  Bier block with MAC    Social History: Social History[1]  Allergies[2]  Family History  Problem Relation Age of Onset   Diabetes Mother    Diabetes Father      Prior to Admission medications  Medication Sig Start Date End Date Taking? Authorizing Provider  Ascorbic Acid (VITAMIN C PO) Take 1 tablet by mouth daily.   Yes [provider]  VITAMIN D, CHOLECALCIFEROL, PO Take 1 capsule by mouth daily.   Yes [provider]  amLODipine  (NORVASC ) 10 MG tablet TAKE 1 TABLET (10 MG TOTAL) BY MOUTH DAILY. Patient not taking: Reported on 07/17/2024 12/30/19 12/29/20  Vicci Barnie NOVAK, MD  atorvastatin  (LIPITOR) 20 MG tablet Take 1 tablet (20 mg total) by mouth daily. Patient not taking: Reported on 07/17/2024 07/06/19   Vicci Barnie NOVAK, MD  glucose blood (TRUE METRIX BLOOD GLUCOSE TEST) test strip Use as instructed Patient not taking: Reported on  07/17/2024 05/22/19   Vicci Barnie NOVAK, MD  LANTUS  SOLOSTAR 100 UNIT/ML Solostar Pen INJECT 18 UNITS INTO THE SKIN DAILY. Patient not taking: Reported on 07/17/2024 07/15/20   Vicci Barnie NOVAK, MD  lisinopril  (ZESTRIL ) 10 MG tablet Take 1 tablet (10 mg total) by mouth daily. Patient not taking: Reported on 07/17/2024 07/06/19   Vicci Barnie NOVAK, MD  metFORMIN  (GLUCOPHAGE ) 1000 MG tablet TAKE 1 TABLET (1,000 MG TOTAL) BY MOUTH 2 (TWO) TIMES DAILY WITH A MEAL. Patient not taking: Reported on 07/17/2024 07/15/20 07/15/21  Vicci Barnie NOVAK, MD    Physical Exam: Vitals:   07/17/24 1122 07/17/24 1140 07/17/24 1547 07/17/24 1809  BP: (!) 170/103  (!) 139/97 (!) 151/101  Pulse: 90  75 83  Resp: 18  17 18   Temp: 98.1 F (36.7 C)  98 F (36.7 C) 98.3 F (36.8 C)  TempSrc:    Oral  SpO2: 100%  100% 98%  Height:  6' (1.829 m)     Constitutional: Resting in bed, NAD, calm, comfortable Eyes: EOMI, lids and conjunctivae normal ENMT: Mucous membranes are moist. Posterior pharynx clear of any exudate or lesions.Normal dentition.  Hearing diminished on the left Neck: normal, supple, no masses. Respiratory: clear to auscultation bilaterally, no wheezing, no crackles. Normal respiratory effort. No accessory muscle use.  Cardiovascular: Regular rate and rhythm, no murmurs / rubs / gallops. No extremity edema. 2+ pedal pulses. Abdomen: no tenderness, no masses palpated. Musculoskeletal: no clubbing / cyanosis. No joint deformity upper and lower extremities. Good ROM, no contractures. Normal muscle tone.  Skin: no rashes, lesions, ulcers. No induration Neurologic: Sensation intact. Strength 5/5 in all 4.  Psychiatric: Normal judgment and insight. Alert and oriented x 3. Normal mood.   EKG: Ordered and pending  Assessment/Plan Principal Problem:   CVA (cerebral vascular accident) (HCC) Active Problems:   Acute hearing loss of left ear   Type 2 diabetes mellitus (HCC)   Hypertension associated with  diabetes (HCC)   Hyperlipidemia associated with type 2 diabetes mellitus (HCC)   Donald Carter is a 49 y.o. male with medical history significant for T2DM, HTN, HLD who presented with acute left-sided hearing loss and is admitted for CVA workup.  Assessment and Plan: Acute left-sided hearing loss Subacute bilateral basal ganglia infarcts: Patient presenting with new onset left-sided hearing loss beginning on 07/16/2024.  MRI brain/IAC showed several subacute basal ganglia infarcts.  No retrocochlear mass.  Remote right thalamic lacunar infarct also seen. - Neurology following - Follow MRA head, carotid Dopplers - Echocardiogram - Follow lipid panel and hemoglobin A1c - Keep on telemetry, continue neurochecks - Started on aspirin  81 mg and Plavix  75 mg daily - Allowing permissive hypertension for now - PT/OT/SLP eval - Will need follow-up with ENT  Type 2 diabetes with hyperglycemia: Has been untreated for several years.  Follow hemoglobin A1c.  Started on SSI.  Hypertension: Allowing permissive hypertension for now.  Not on antihypertensives at home.  Renal insufficiency: Creatinine 1.45 on admission.  No recent baseline labs available.  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  Hyperlipidemia: Start atorvastatin  40 mg daily.  Follow lipid panel.   DVT prophylaxis: enoxaparin  (LOVENOX )  injection 40 mg Start: 07/17/24 2000 Code Status: Full code Family Communication: Discussed with patient, he has discussed with family Disposition Plan: From home and likely discharge to home pending clinical progress Consults called: Neurology Severity of Illness: The appropriate patient status for this patient is OBSERVATION. Observation status is judged to be reasonable and necessary in order to provide the required intensity of service to ensure the patient's safety. The patient's presenting symptoms, physical exam findings, and initial radiographic and  laboratory data in the context of their medical condition is felt to place them at decreased risk for further clinical deterioration. Furthermore, it is anticipated that the patient will be medically stable for discharge from the hospital within 2 midnights of admission.   Jorie Blanch MD [facility]  If 7PM-7AM, please contact night-coverage [email or web address]  07/17/2024, 7:55 PM      [1]  Social History Tobacco Use   Smoking status: Never   Smokeless tobacco: Never  Vaping Use   Vaping status: Never Used  Substance Use Topics   Alcohol use: Yes    Comment: ocasional   Drug use: No  [2] No Known Allergies  "

## 2024-07-17 NOTE — Consult Note (Signed)
 NEUROLOGY CONSULT NOTE   Date of service: July 17, 2024 Patient Name: Donald Carter MRN:  [id number] DOB:  03/30/1976 Chief Complaint: Tinnitus, pressure and hearing loss in the left ear Requesting Provider: Yolande Lamar BROCKS, MD  History of Present Illness  Donald Carter is a 49 y.o. male with hx of hypertension, hyperlipidemia and diabetes who presents with tinnitus, pressure and hearing loss in the left ear which have been present since yesterday morning.  He reports that he was in his usual state of health when he went to bed the day before yesterday, but when he woke up he felt a poor sensation of pressure in his left ear, as if he had a ear plug in and noticed a loud ringing in only his left ear.  He was unable to hear voices in his left ear.  Hearing in his right ear is normal, and he can identify no event which would have led to the tinnitus and hearing loss.  He reports having a recent cold with a runny nose but no sinus congestion or pressure in the ears and no other recent symptoms.  MRI brain taken in the ED revealed several small acute ischemic infarcts.  LKW: Uncertain, patient has no symptoms Modified rankin score: 0-Completely asymptomatic and back to baseline post- stroke IV Thrombolysis: No, completed stroke on MRI EVT: No, exam not consistent with LVO  NIHSS components Score: Comment  1a Level of Conscious 0[x]  1[]  2[]  3[]      1b LOC Questions 0[x]  1[]  2[]       1c LOC Commands 0[x]  1[]  2[]       2 Best Gaze 0[x]  1[]  2[]       3 Visual 0[x]  1[]  2[]  3[]      4 Facial Palsy 0[x]  1[]  2[]  3[]      5a Motor Arm - left 0[x]  1[]  2[]  3[]  4[]  UN[]    5b Motor Arm - Right 0[x]  1[]  2[]  3[]  4[]  UN[]    6a Motor Leg - Left 0[x]  1[]  2[]  3[]  4[]  UN[]    6b Motor Leg - Right 0[x]  1[]  2[]  3[]  4[]  UN[]    7 Limb Ataxia 0[x]  1[]  2[]  UN[]      8 Sensory 0[x]  1[]  2[]  UN[]      9 Best Language 0[x]  1[]  2[]  3[]      10 Dysarthria 0[x]  1[]  2[]  UN[]      11 Extinct. and Inattention 0[x]  1[]   2[]       TOTAL:0       ROS  Comprehensive ROS performed and pertinent positives documented in HPI   Past History   Past Medical History:  Diagnosis Date   Diabetes mellitus without complication (HCC)    Hypertension     Past Surgical History:  Procedure Laterality Date   FINGER EXPLORATION     Rt index finger   INCISION AND DRAINAGE Right 02/21/2017   Procedure: INCISION AND DRAINAGE WITH EXCISION PYOGENIC GRANULOMA RIGHT INDEX FINGER;  Surgeon: Murrell Drivers, MD;  Location: [facility];  Service: Orthopedics;  Laterality: Right;  Bier block with MAC    Family History: Family History  Problem Relation Age of Onset   Diabetes Mother    Diabetes Father     Social History  reports that he has never smoked. He has never used smokeless tobacco. He reports current alcohol use. He reports that he does not use drugs.  Allergies[1]  Medications  Current Medications[2]  Vitals   Vitals:   07/17/24 1122 07/17/24 1140 07/17/24 1547  BP: ROLLEN)  170/103  (!) 139/97  Pulse: 90  75  Resp: 18  17  Temp: 98.1 F (36.7 C)  98 F (36.7 C)  SpO2: 100%  100%  Height:  6' (1.829 m)     Body mass index is 30.73 kg/m.   Physical Exam   Constitutional: Appears well-developed and well-nourished.  Psych: Affect appropriate to situation.  Eyes: No scleral injection.  HENT: No OP obstruction.  Head: Normocephalic.  Respiratory: Effort normal, non-labored breathing.  Skin: WDI.   Neurologic Examination    NEURO:  Mental Status: AA&Ox3, able to give clear and coherent history of present illness Speech/Language: speech is without dysarthria or aphasia.  Fluency, and comprehension intact.  Cranial Nerves:  II: PERRL.  III, IV, VI: EOMI. Eyelids elevate symmetrically.  V: Sensation is intact to light touch and symmetrical to face.  VII: Smile is symmetrical.  VIII: hearing intact to voice in the right ear, hearing intact air and bone-conduction in both right ear and  left ear, although patient hears tuning fork louder in right ear than left.  Unable to hear voices or other sounds in the left ear IX, X: Phonation is normal.  KP:Dynloizm shrug 5/5. XII: tongue is midline without fasciculations. Motor: 5/5 strength to all muscle groups tested.  Tone: is normal and bulk is normal Sensation- Intact to light touch bilaterally.  Coordination: FTN intact bilaterally Gait- deferred   Labs/Imaging/Neurodiagnostic studies   CBC:  Recent Labs  Lab Aug 15, 2024 1142  WBC 6.8  HGB 12.3*  HCT 34.6*  MCV 80.3  PLT 274   Basic Metabolic Panel:  Lab Results  Component Value Date   NA 132 (L) 08/15/2024   K 4.9 2024/08/15   CO2 25 08/15/24   GLUCOSE 378 (H) 08/15/24   BUN 17 08/15/24   CREATININE 1.45 (H) 2024-08-15   CALCIUM  9.3 08-15-2024   GFRNONAA 59 (L) 08-15-24   GFRAA 85 05/22/2019   Lipid Panel:  Lab Results  Component Value Date   LDLCALC 148 (H) 05/22/2019   HgbA1c:  Lab Results  Component Value Date   HGBA1C 12.3 (A) 05/22/2019   Urine Drug Screen:     Component Value Date/Time   LABOPIA NEGATIVE 08-15-2024 1350   COCAINSCRNUR NEGATIVE 08-15-2024 1350   LABBENZ NEGATIVE 2024/08/15 1350   AMPHETMU NEGATIVE 08-15-2024 1350   THCU NEGATIVE 2024-08-15 1350   LABBARB NEGATIVE 15-Aug-2024 1350    Alcohol Level     Component Value Date/Time   ETH <5 06/22/2016 0644   INR  Lab Results  Component Value Date   INR 0.97 06/22/2016   APTT  Lab Results  Component Value Date   APTT 29 06/22/2016   CT Head without contrast(Personally reviewed): No acute abnormality  CT angio Head and Neck with contrast(Personally reviewed): Pending  MRI Brain(Personally reviewed): 2 small foci of restricted diffusion in the left basal ganglia with peripheral enhancement, additional focus of enhancement in right basal ganglia, most likely an additional enhancing subacute stroke, no retrocochlear mass  ASSESSMENT   Donald Carter is a  49 y.o. male with history of hypertension, hyperlipidemia and diabetes who presents with sudden onset left-sided tinnitus, feeling of pressure in the ear and hearing loss.  CT head revealed no acute abnormality, and MRI brain demonstrated 3 small subacute strokes but no retrocochlear mass or reason for tinnitus and hearing loss.  On exam, patient is unable to hear voices or environmental noises from left ear, but is able to hear tuning fork through  both air and bone-conduction.  He states that loud tinnitus and feeling of pressure in his ear canal persist.  Otoscopic exam by emergency department provider did not find obstruction or other mechanical reason for hearing loss.  Patient will need admission for stroke workup and ENT consult for further investigation of tinnitus and hearing loss.    RECOMMENDATIONS  Stroke/TIA Workup  - Admit for stroke workup - Permissive HTN x48 hrs goal BP <220/110. PRN labetalol or hydralazine  if BP above these parameters. Avoid oral antihypertensives. - MRI brain wo contrast - CTA/MRA if not already obtained - TTE w/ bubble - Check A1c and LDL + add statin per guidelines - antiplt/anticoag aspirin  81 mg daily and Plavix  75 mg daily  - q4 hr neuro checks - STAT head CT for any change in neuro exam - Tele - PT/OT/SLP - Stroke education - Amb referral to neurology upon discharge   - ENT consult  ______________________________________________________________________  Patient seen by NP and then by MD, MD to edit note as needed.  Signed, Cortney E Everitt Clint Kill, NP [facility]   I have seen the patient and reviewed the above note.  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  As always, with multiple strokes, it is difficult to rule out tiny embolic events.  I do wonder if it is possible that he had some type of ischemic event affecting his 8th cranial nerve as  well, as I would not typically expect hearing changes from the strokes and feel these are incidental findings.  This does mean that they are of unclear time of onset.  Whether they are incidental or not, they are real ischemic strokes and secondary prevention needs to be taken seriously and he is being admitted to optimize this.   Aisha Seals, MD [facility]   If 7pm- 7am, please page neurology on call as listed in AMION.     [1] No Known Allergies [2] No current facility-administered medications for this encounter.  Current Outpatient Medications:    amLODipine  (NORVASC ) 10 MG tablet, TAKE 1 TABLET (10 MG TOTAL) BY MOUTH DAILY., Disp: 90 tablet, Rfl: 1   atorvastatin  (LIPITOR) 20 MG tablet, Take 1 tablet (20 mg total) by mouth daily., Disp: 90 tablet, Rfl: 3   Blood Glucose Monitoring Suppl (TRUE METRIX METER) w/Device KIT, Use as directed, Disp: 1 kit, Rfl: 0   glucose blood (TRUE METRIX BLOOD GLUCOSE TEST) test strip, Use as instructed, Disp: 100 each, Rfl: 12   Insulin  Pen Needle (PEN NEEDLES) 31G X 8 MM MISC, UAD, Disp: 100 each, Rfl: 6   LANTUS  SOLOSTAR 100 UNIT/ML Solostar Pen, INJECT 18 UNITS INTO THE SKIN DAILY., Disp: 6 mL, Rfl: 0   lisinopril  (ZESTRIL ) 10 MG tablet, Take 1 tablet (10 mg total) by mouth daily., Disp: 90 tablet, Rfl: 1   metFORMIN  (GLUCOPHAGE ) 1000 MG tablet, TAKE 1 TABLET (1,000 MG TOTAL) BY MOUTH 2 (TWO) TIMES DAILY WITH A MEAL., Disp: 60 tablet, Rfl: 0   TRUEplus Lancets 28G MISC, Use as directed, Disp: 100 each, Rfl: 4

## 2024-07-18 ENCOUNTER — Observation Stay (HOSPITAL_COMMUNITY)

## 2024-07-18 ENCOUNTER — Other Ambulatory Visit: Payer: Self-pay | Admitting: Cardiology

## 2024-07-18 DIAGNOSIS — E785 Hyperlipidemia, unspecified: Secondary | ICD-10-CM

## 2024-07-18 DIAGNOSIS — H9312 Tinnitus, left ear: Secondary | ICD-10-CM | POA: Diagnosis not present

## 2024-07-18 DIAGNOSIS — I63039 Cerebral infarction due to thrombosis of unspecified carotid artery: Secondary | ICD-10-CM | POA: Diagnosis not present

## 2024-07-18 DIAGNOSIS — E1151 Type 2 diabetes mellitus with diabetic peripheral angiopathy without gangrene: Secondary | ICD-10-CM

## 2024-07-18 DIAGNOSIS — I5030 Unspecified diastolic (congestive) heart failure: Secondary | ICD-10-CM | POA: Diagnosis not present

## 2024-07-18 DIAGNOSIS — I6381 Other cerebral infarction due to occlusion or stenosis of small artery: Secondary | ICD-10-CM | POA: Diagnosis not present

## 2024-07-18 DIAGNOSIS — Z794 Long term (current) use of insulin: Secondary | ICD-10-CM | POA: Diagnosis not present

## 2024-07-18 DIAGNOSIS — I10 Essential (primary) hypertension: Secondary | ICD-10-CM | POA: Diagnosis not present

## 2024-07-18 DIAGNOSIS — I639 Cerebral infarction, unspecified: Secondary | ICD-10-CM

## 2024-07-18 LAB — ECHOCARDIOGRAM COMPLETE
AR max vel: 3.58 cm2
AV Area VTI: 3.37 cm2
AV Area mean vel: 3.64 cm2
AV Mean grad: 5 mmHg
AV Peak grad: 9.4 mmHg
Ao pk vel: 1.53 m/s
Area-P 1/2: 4.01 cm2
Calc EF: 58.5 %
Height: 72 in
MV VTI: 4.61 cm2
S' Lateral: 3.3 cm
Single Plane A2C EF: 62.2 %
Single Plane A4C EF: 55.1 %
Weight: 2970.04 [oz_av]

## 2024-07-18 LAB — GLUCOSE, RANDOM: Glucose, Bld: 396 mg/dL — ABNORMAL HIGH (ref 70–99)

## 2024-07-18 LAB — GLUCOSE, CAPILLARY
Glucose-Capillary: 281 mg/dL — ABNORMAL HIGH (ref 70–99)
Glucose-Capillary: 204 mg/dL — ABNORMAL HIGH (ref 70–99)
Glucose-Capillary: 144 mg/dL — ABNORMAL HIGH (ref 70–99)
Glucose-Capillary: 209 mg/dL — ABNORMAL HIGH (ref 70–99)

## 2024-07-18 LAB — LIPID PANEL
Cholesterol: 242 mg/dL — ABNORMAL HIGH (ref 0–200)
HDL: 54 mg/dL
LDL Cholesterol: 160 mg/dL — ABNORMAL HIGH (ref 0–99)
Total CHOL/HDL Ratio: 4.5 ratio
Triglycerides: 140 mg/dL
VLDL: 28 mg/dL (ref 0–40)

## 2024-07-18 LAB — CBC
HCT: 34.8 % — ABNORMAL LOW (ref 39.0–52.0)
Hemoglobin: 12.4 g/dL — ABNORMAL LOW (ref 13.0–17.0)
MCH: 28.6 pg (ref 26.0–34.0)
MCHC: 35.6 g/dL (ref 30.0–36.0)
MCV: 80.2 fL (ref 80.0–100.0)
Platelets: 255 K/uL (ref 150–400)
RBC: 4.34 MIL/uL (ref 4.22–5.81)
RDW: 11.9 % (ref 11.5–15.5)
WBC: 3.8 K/uL — ABNORMAL LOW (ref 4.0–10.5)
nRBC: 0 % (ref 0.0–0.2)

## 2024-07-18 LAB — BASIC METABOLIC PANEL WITH GFR
Anion gap: 10 (ref 5–15)
BUN: 16 mg/dL (ref 6–20)
CO2: 25 mmol/L (ref 22–32)
Calcium: 8.9 mg/dL (ref 8.9–10.3)
Chloride: 101 mmol/L (ref 98–111)
Creatinine, Ser: 1.34 mg/dL — ABNORMAL HIGH (ref 0.61–1.24)
GFR, Estimated: >60 mL/min
Glucose, Bld: 280 mg/dL — ABNORMAL HIGH (ref 70–99)
Potassium: 4.1 mmol/L (ref 3.5–5.1)
Sodium: 135 mmol/L (ref 135–145)

## 2024-07-18 LAB — ANTITHROMBIN III: AntiThromb III Func: 111 % (ref 75–120)

## 2024-07-18 LAB — SYPHILIS: RPR W/REFLEX TO RPR TITER AND TREPONEMAL ANTIBODIES, TRADITIONAL SCREENING AND DIAGNOSIS ALGORITHM: RPR Ser Ql: NONREACTIVE

## 2024-07-18 LAB — HEMOGLOBIN A1C
Hgb A1c MFr Bld: 12.5 % — ABNORMAL HIGH (ref 4.8–5.6)
Mean Plasma Glucose: 312.05 mg/dL

## 2024-07-18 MED ORDER — INSULIN ASPART 100 UNIT/ML IJ SOLN
5.0000 [IU] | Freq: Three times a day (TID) | INTRAMUSCULAR | Status: DC
  Administered 2024-07-18 – 2024-07-19 (×3): 5 [IU] via SUBCUTANEOUS
  Filled 2024-07-18 (×3): qty 5

## 2024-07-18 MED ORDER — ONDANSETRON HCL 4 MG/2ML IJ SOLN: 4.0000 mg | Freq: Four times a day (QID) | INTRAMUSCULAR | Status: DC | PRN

## 2024-07-18 MED ORDER — GLUCAGON HCL RDNA (DIAGNOSTIC) 1 MG IJ SOLR: 1.0000 mg | INTRAMUSCULAR | Status: DC | PRN

## 2024-07-18 MED ORDER — LABETALOL HCL 5 MG/ML IV SOLN: 10.0000 mg | INTRAVENOUS | Status: DC | PRN

## 2024-07-18 MED ORDER — TRAZODONE HCL 50 MG PO TABS
50.0000 mg | ORAL_TABLET | Freq: Every evening | ORAL | Status: DC | PRN
  Filled 2024-07-18: qty 1

## 2024-07-18 MED ORDER — INSULIN GLARGINE 100 UNIT/ML ~~LOC~~ SOLN
15.0000 [IU] | Freq: Every day | SUBCUTANEOUS | Status: DC
  Administered 2024-07-18: 15 [IU] via SUBCUTANEOUS
  Filled 2024-07-18 (×2): qty 0.15

## 2024-07-18 MED ORDER — IOHEXOL 350 MG/ML SOLN
75.0000 mL | Freq: Once | INTRAVENOUS | Status: AC | PRN
  Administered 2024-07-18: 75 mL via INTRAVENOUS

## 2024-07-18 MED ORDER — IPRATROPIUM-ALBUTEROL 0.5-2.5 (3) MG/3ML IN SOLN: 3.0000 mL | RESPIRATORY_TRACT | Status: DC | PRN

## 2024-07-18 NOTE — Plan of Care (Signed)
" °  Problem: Education: Goal: Knowledge of disease or condition will improve Outcome: Progressing Goal: Knowledge of secondary prevention will improve (MUST DOCUMENT ALL) Outcome: Progressing Goal: Knowledge of patient specific risk factors will improve (DELETE if not current risk factor) Outcome: Progressing   Problem: Ischemic Stroke/TIA Tissue Perfusion: Goal: Complications of ischemic stroke/TIA will be minimized Outcome: Progressing   Problem: Coping: Goal: Will verbalize positive feelings about self Outcome: Progressing Goal: Will identify appropriate support needs Outcome: Progressing   Problem: Health Behavior/Discharge Planning: Goal: Ability to manage health-related needs will improve Outcome: Progressing Goal: Goals will be collaboratively established with patient/family Outcome: Progressing   Problem: Self-Care: Goal: Ability to participate in self-care as condition permits will improve Outcome: Progressing Goal: Verbalization of feelings and concerns over difficulty with self-care will improve Outcome: Progressing Goal: Ability to communicate needs accurately will improve Outcome: Progressing   Problem: Nutrition: Goal: Risk of aspiration will decrease Outcome: Progressing Goal: Dietary intake will improve Outcome: Progressing   Problem: Nutrition: Goal: Risk of aspiration will decrease Outcome: Progressing Goal: Dietary intake will improve Outcome: Progressing   Problem: Education: Goal: Ability to describe self-care measures that may prevent or decrease complications (Diabetes Survival Skills Education) will improve Outcome: Progressing Goal: Individualized Educational Video(s) Outcome: Progressing   Problem: Coping: Goal: Ability to adjust to condition or change in health will improve Outcome: Progressing   Problem: Fluid Volume: Goal: Ability to maintain a balanced intake and output will improve Outcome: Progressing   Problem: Health  Behavior/Discharge Planning: Goal: Ability to identify and utilize available resources and services will improve Outcome: Progressing Goal: Ability to manage health-related needs will improve Outcome: Progressing   Problem: Metabolic: Goal: Ability to maintain appropriate glucose levels will improve Outcome: Progressing   Problem: Nutritional: Goal: Maintenance of adequate nutrition will improve Outcome: Progressing Goal: Progress toward achieving an optimal weight will improve Outcome: Progressing   Problem: Skin Integrity: Goal: Risk for impaired skin integrity will decrease Outcome: Progressing   Problem: Tissue Perfusion: Goal: Adequacy of tissue perfusion will improve Outcome: Progressing   Problem: Education: Goal: Knowledge of General Education information will improve Description: Including pain rating scale, medication(s)/side effects and non-pharmacologic comfort measures Outcome: Progressing   Problem: Health Behavior/Discharge Planning: Goal: Ability to manage health-related needs will improve Outcome: Progressing   Problem: Clinical Measurements: Goal: Ability to maintain clinical measurements within normal limits will improve Outcome: Progressing Goal: Will remain free from infection Outcome: Progressing Goal: Diagnostic test results will improve Outcome: Progressing Goal: Respiratory complications will improve Outcome: Progressing Goal: Cardiovascular complication will be avoided Outcome: Progressing   Problem: Activity: Goal: Risk for activity intolerance will decrease Outcome: Progressing   Problem: Nutrition: Goal: Adequate nutrition will be maintained Outcome: Progressing   Problem: Coping: Goal: Level of anxiety will decrease Outcome: Progressing   Problem: Elimination: Goal: Will not experience complications related to bowel motility Outcome: Progressing Goal: Will not experience complications related to urinary retention Outcome:  Progressing   Problem: Pain Managment: Goal: General experience of comfort will improve and/or be controlled Outcome: Progressing   Problem: Safety: Goal: Ability to remain free from injury will improve Outcome: Progressing   Problem: Skin Integrity: Goal: Risk for impaired skin integrity will decrease Outcome: Progressing   "

## 2024-07-18 NOTE — Progress Notes (Addendum)
 STROKE TEAM PROGRESS NOTE    INTERIM HISTORY/SUBJECTIVE  Patient remains hemodynamically stable and afebrile.  He continues to complain of tinnitus in his left ear with decreased hearing, characterizing the sound as being like radio static.  OBJECTIVE  CBC    Component Value Date/Time   WBC 3.8 (L) 07/18/2024 0424   RBC 4.34 07/18/2024 0424   HGB 12.4 (L) 07/18/2024 0424   HGB 15.1 05/22/2019 0953   HCT 34.8 (L) 07/18/2024 0424   HCT 43.1 05/22/2019 0953   PLT 255 07/18/2024 0424   PLT 228 05/22/2019 0953   MCV 80.2 07/18/2024 0424   MCV 84 05/22/2019 0953   MCH 28.6 07/18/2024 0424   MCHC 35.6 07/18/2024 0424   RDW 11.9 07/18/2024 0424   RDW 12.5 05/22/2019 0953   LYMPHSABS 1.5 07/20/2018 1556   MONOABS 0.2 07/20/2018 1556   EOSABS 0.0 07/20/2018 1556   BASOSABS 0.0 07/20/2018 1556    BMET    Component Value Date/Time   NA 135 07/18/2024 0424   NA 137 05/22/2019 0953   K 4.1 07/18/2024 0424   CL 101 07/18/2024 0424   CO2 25 07/18/2024 0424   GLUCOSE 280 (H) 07/18/2024 0424   BUN 16 07/18/2024 0424   BUN 10 05/22/2019 0953   CREATININE 1.34 (H) 07/18/2024 0424   CALCIUM  8.9 07/18/2024 0424   GFRNONAA >60 07/18/2024 0424    IMAGING past 24 hours CT ANGIO HEAD NECK W WO CM Result Date: 07/18/2024 EXAM: CTA Head and Neck with Intravenous Contrast. CT Head without Contrast. CLINICAL HISTORY: Stroke/TIA, determine embolic source. Acute/subacute infarcts involving the left lentiform nucleus. TECHNIQUE: Axial CTA images of the head and neck performed with intravenous contrast. MIP reconstructed images were created and reviewed. Axial computed tomography images of the head/brain performed without intravenous contrast. Note: Per PQRS, the description of internal carotid artery percent stenosis, including 0 percent or normal exam, is based on [employer] Symptomatic Carotid Endarterectomy Trial (NASCET) criteria. Dose reduction technique was used including one or more of  the following: automated exposure control, adjustment of mA and kV according to patient size, and/or iterative reconstruction. CONTRAST: With; 75 mL (iohexol  (OMNIPAQUE ) 350 MG/ML injection 75 mL IOHEXOL  350 MG/ML SOLN). COMPARISON: None provided. FINDINGS: CT HEAD: BRAIN: Noncontrast head images demonstrate a subacute left basal ganglia infarct. No acute intraparenchymal hemorrhage. No mass lesion. No CT evidence for acute territorial infarct. No midline shift or extra-axial collection. VENTRICLES: No hydrocephalus. ORBITS: The orbits are unremarkable. SINUSES AND MASTOIDS: The paranasal sinuses and mastoid air cells are clear. CTA NECK: COMMON CAROTID ARTERIES: Common origin of the left common carotid artery and the innominate artery was noted, a normal variant. No significant stenosis. No dissection or occlusion. INTERNAL CAROTID ARTERIES: No stenosis by NASCET criteria. No dissection or occlusion. VERTEBRAL ARTERIES: The right vertebral artery is dominant. The left vertebral artery originates directly from the aortic arch, a normal variant. No significant stenosis. No dissection or occlusion. CTA HEAD: ANTERIOR CEREBRAL ARTERIES: No significant stenosis. No occlusion. No aneurysm. MIDDLE CEREBRAL ARTERIES: No significant stenosis. No occlusion. No aneurysm. POSTERIOR CEREBRAL ARTERIES: A right fetal type posterior cerebral artery is present. The left posterior cerebral artery originates from the left P1 segment and the left posterior communicating artery. The left posterior communicating artery is occluded. PCA branch vessels are within normal limits bilaterally. BASILAR ARTERY: The basilar artery is small. No significant stenosis. No occlusion. No aneurysm. OTHER: SOFT TISSUES: No acute finding. No masses or lymphadenopathy. BONES: No acute  osseous abnormality. IMPRESSION: 1. Acute/subacute infarcts involving the left lentiform nucleus. 2. 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 Electronically signed by: Lonni Necessary MD 07/18/2024 11:46 AM EST RP Workstation: [id number]   MR BRAIN/IAC W WO CONTRAST Result Date: 07/17/2024 EXAM: MR Internal Auditory Canals without and with Contrast 07/17/2024 03:11:00 PM TECHNIQUE: Multiplanar, multisequence MRI of the internal auditory canals was performed without and with administration of intravenous contrast. COMPARISON: MRI HEAD from 06/22/2016. CLINICAL HISTORY: Hearing loss ICD-10 Code: 357797 FINDINGS: INTERNAL AUDITORY CANALS: No mass or abnormal enhancement. Inner ear structures are unremarkable. MASTOIDS AND MIDDLE EARS: Clear. BRAIN AND BRAINSTEM: Two small foci of restricted diffusion in the left basal ganglia including an area with peripheral enhancement for example, c-series 3, images 21 and 28 and series 14, image 76. Mild edema without mass effect. Additional punctate focus of enhancement in the right basal ganglia (series 14 image 70) without associated restricted diffusion. No edema there. Remote right thalamic lacunar infarct. SINUSES: No acute abnormality. BONES AND SOFT TISSUES: Normal bone marrow signal. IMPRESSION: 1. Two small foci of restricted diffusion in the left basal ganglia including an area with peripheral enhancement, most likely subacute enhancing perforator infarcts. 2. Additional punctate focus of enhancement in the right basal ganglia without associated restricted diffusion. While nonspecific, given the above findings this most likely represents an additional enhancing subacute infarct. The distribution and appearance is atypical for demyelination or metastases. Atypical infection (such as cryptococcosis) is also less likely but should be considered if the patient is immunocompromised. 3. A short-interval follow up MRI head with contrast in 4-6 weeks is recommended to help ensure expected  evolution of the above findings. 4. No retrocochlear mass. 5. Remote right thalamic lacunar infarct. Electronically signed by: Glendia Molt MD 07/17/2024 03:32 PM EST RP Workstation: [id number]   CT Head Wo Contrast Result Date: 07/17/2024 EXAM: CT HEAD WITHOUT CONTRAST 07/17/2024 01:10:52 PM TECHNIQUE: CT of the head was performed without the administration of intravenous contrast. Automated exposure control, iterative reconstruction, and/or weight based adjustment of the mA/kV was utilized to reduce the radiation dose to as low as reasonably achievable. COMPARISON: None available. CLINICAL HISTORY: Neuro deficit, acute, stroke suspected; acute L hearing loss FINDINGS: BRAIN AND VENTRICLES: No acute hemorrhage. No evidence of acute infarct. No hydrocephalus. No extra-axial collection. No mass effect or midline shift. ORBITS: No acute abnormality. SINUSES: Right maxillary sinus mucosal thickening. SOFT TISSUES AND SKULL: No acute soft tissue abnormality. No skull fracture. IMPRESSION: 1. No acute intracranial abnormality. Electronically signed by: Ryan Chess MD 07/17/2024 01:16 PM EST RP Workstation: [id number]    Vitals:   07/18/24 0300 07/18/24 0532 07/18/24 0809 07/18/24 1126  BP: (!) 146/95 (!) 140/94 (!) 147/97 (!) 155/100  Pulse: 73 79 84 85  Resp: 12  15 17   Temp:  98.5 F (36.9 C) 98 F (36.7 C) 97.6 F (36.4 C)  TempSrc:  Oral Oral Oral  SpO2: 98% 100% 100% 100%  Weight:  84.2 kg    Height:  6' (1.829 m)       PHYSICAL EXAM General:  Alert, well-nourished, well-developed patient in no acute distress Psych:  Mood and affect appropriate for situation CV: Regular rate and rhythm on monitor Respiratory:  Regular, unlabored respirations on room air    NEURO:  Mental Status: AA&Ox3, patient is able to give clear and coherent history Speech/Language: speech is without  dysarthria or aphasia.    Cranial Nerves:  II: PERRL. Visual fields full.  III, IV, VI: EOMI. Eyelids elevate  symmetrically.  V: Sensation is intact to light touch and symmetrical to face.  VII: Face is symmetrical resting and smiling VIII: hearing intact to voice. IX, X: Phonation is normal.  KP:Dynloizm shrug 5/5. XII: tongue is midline without fasciculations. Motor: 5/5 strength to all muscle groups tested.  Tone: is normal and bulk is normal Sensation- Intact to light touch bilaterally.  Coordination: FTN intact bilaterally Gait- deferred  Most Recent NIH 0  ASSESSMENT/PLAN  Mr. Donald Carter is a 50 y.o. male with history of hypertension, hyperlipidemia and diabetes who originally presented for loud tinnitus in the left ear and was found to have 2 incidental strokes on brain MRI.  NIH on Admission 0  Tinnitus with hearing loss, left Acute onset left eye tinnitus with mild hearing loss after woke up No associated nystagmus, vertigo or ataixa Recommend ENT consult  Stroke, incidental finding: 2 small left basal ganglier infarcts, etiology likely small vessel disease but can not completely rule out embolic source as they are separate location and just adjacent to insular cortex CT head No acute abnormality.  CTA head & neck occlusion of the left PCOM, left PCA patent MRI 2 small foci of restricted diffusion in left basal ganglia with additional focus of enhancement in right basal ganglia without associated restricted diffusion, most likely a subacute infarct 2D Echo EF 55-60% Patient will need 30-day cardiac monitor at discharge Hypercoagulable panel pending LDL 160 HgbA1c 12.5 UDS neg VTE prophylaxis -Lovenox  No antithrombotic prior to admission, now on aspirin  81 mg daily and clopidogrel  75 mg daily for 3 weeks and then aspirin  alone. Therapy recommendations:  No follow up needed  Disposition: Pending   Hypertension Home meds: None Stable Long term BP goal normotensive  Hyperlipidemia Home meds: Atorvastatin  20 mg daily, increased to 40 LDL 160, goal < 70 Continue statin  at discharge  Diabetes type II poorly controlled Home meds: None HgbA1c 12.5, goal < 7.0 CBGs SSI On insulin  Diabetes coordinator consults Recommend close follow-up with PCP for better DM control  Other Stroke Risk Factors   Other Active Problems   Hospital day # 0  Patient seen by NP and then by MD, MD to edit note as needed. Cortney E Everitt Clint Kill , MSN, AGACNP-BC [facility] See Amion for schedule and pager information 07/18/2024 12:36 PM  ATTENDING NOTE: I reviewed above note and agree with the assessment and plan. Pt was seen and examined.   No family at the bedside. Pt lying in bed, neuro intact except mildly decreased left hearing, complaining of tinnitus on the left. No nystagmus, vertigo or ataixa. Recommend ENT consult. Pt does have two left BG small infarcts, likely small vessel disease given the uncontrolled risk factors. However given the separate location and near insular cortex, will check hypercoagulable labs and recommend 30 day monitoring. Now on DAPT for 3 weeks and then ASA alone. Increase lipitor from 20 to 40. Aggressive risk factor modification. Follow up at [facility].  For detailed assessment and plan, please refer to above as I have made changes wherever appropriate.   Neurology will sign off. Please call with questions. Pt will follow up with stroke clinic NP at [facility] in about 4 weeks. Thanks for the consult.   Ary Cummins, MD PhD Stroke Neurology 07/18/2024 3:07 PM      To contact Stroke Continuity provider, please refer to  [email or web address]. After hours, contact General Neurology

## 2024-07-18 NOTE — Evaluation (Signed)
 Occupational Therapy Evaluation Patient Details Name: Donald Carter MRN: [id number] DOB: 11-16-1975 Today's Date: 07/18/2024   History of Present Illness   Pt is a 49 y/o male presenting on 07/17/24 with tinnitus, pressure and hearing loss in L ear. CT negative.  MRI with 2 small foci of restricted diffusion in L basal ganglia with peripheral enhancement, additional punctate focus in R basal ganglia; remote R thalamic lacunar infarct. PMH includes: DM, HTN.     Clinical Impressions Pt admitted for above and presents near baseline at modified independent to independent level for ADLs, transfers and mobility.  Pt with no changes in strength, coordination, or sensation.  Cognition appears WFL. Pt does have nystagmus with scanning L, but denies changes in vision- encouraged follow up with ophthalmologist.  Patient with no further acute OT needs and OT will sign off.      If plan is discharge home, recommend the following:         Functional Status Assessment         Equipment Recommendations   None recommended by OT     Recommendations for Other Services   PT consult     Precautions/Restrictions   Precautions Precautions: None Restrictions Weight Bearing Restrictions Per Provider Order: No     Mobility Bed Mobility               General bed mobility comments: OOB upon entry and at exit in recliner    Transfers Overall transfer level: Independent                        Balance Overall balance assessment: No apparent balance deficits (not formally assessed)                                         ADL either performed or assessed with clinical judgement   ADL Overall ADL's : Independent                                             Vision Baseline Vision/History: 1 Wears glasses Ability to See in Adequate Light: 0 Adequate Patient Visual Report: No change from baseline Vision Assessment?: No apparent  visual deficits Additional Comments: pt able to scan and read without difficulty.  did note nystagmus with scanning to L, but pt denies changes in vision or nausea.  Encouraged pt to follow up with eye dr.     [name]         Praxis         Pertinent Vitals/Pain Pain Assessment Pain Assessment: No/denies pain     Extremity/Trunk Assessment Upper Extremity Assessment Upper Extremity Assessment: Overall WFL for tasks assessed   Lower Extremity Assessment Lower Extremity Assessment: Defer to PT evaluation   Cervical / Trunk Assessment Cervical / Trunk Assessment: Normal   Communication Communication Communication: Impaired Factors Affecting Communication: Hearing impaired (loss of hearing L ear)   Cognition Arousal: Alert Behavior During Therapy: WFL for tasks assessed/performed Cognition: No apparent impairments                               Following commands: Intact       Cueing  General Comments   Cueing  Techniques: Verbal cues      Exercises     Shoulder Instructions      Home Living Family/patient expects to be discharged to:: Private residence Living Arrangements: Spouse/significant other (gf) Available Help at Discharge: Family;Available 24 hours/day Type of Home: Apartment Home Access: Stairs to enter Entrance Stairs-Number of Steps: 1 back (usually goes this way) , 5-6 front Entrance Stairs-Rails: Can reach both (in front) Home Layout: One level     Bathroom Shower/Tub: Chief Strategy Officer: Standard     Home Equipment: None      Lives With: Family    Prior Functioning/Environment Prior Level of Function : Independent/Modified Independent;Driving;Working/employed               ADLs Comments: [employer]- [employer] (3rd shift)    OT Problem List: Impaired vision/perception   OT Treatment/Interventions:        OT Goals(Current goals can be found in the care plan section)   Acute Rehab OT  Goals Patient Stated Goal: home OT Goal Formulation: With patient   OT Frequency:       Co-evaluation              AM-PAC OT 6 Clicks Daily Activity     Outcome Measure Help from another person eating meals?: None Help from another person taking care of personal grooming?: None Help from another person toileting, which includes using toliet, bedpan, or urinal?: None Help from another person bathing (including washing, rinsing, drying)?: None Help from another person to put on and taking off regular upper body clothing?: None Help from another person to put on and taking off regular lower body clothing?: None 6 Click Score: 24   End of Session Nurse Communication: Mobility status  Activity Tolerance: Patient tolerated treatment well Patient left: in chair;with call bell/phone within reach  OT Visit Diagnosis: Other abnormalities of gait and mobility (R26.89);Other symptoms and signs involving the nervous system (R29.898)                Time: 9081-9058 OT Time Calculation (min): 23 min Charges:  OT General Charges $OT Visit: 1 Visit OT Evaluation $OT Eval Low Complexity: 1 Low  Etta NOVAK, OT Acute Rehabilitation Services Office [phone] Secure Chat Preferred    Etta GORMAN [name] 07/18/2024, 11:51 AM

## 2024-07-18 NOTE — Progress Notes (Addendum)
 " PROGRESS NOTE    Donald Carter  FMW:[id number] DOB: Jul 13, 1975 DOA: 07/17/2024 PCP: Vicci Barnie NOVAK, MD    Brief Narrative:   49 year old with history of DM2, HTN, HLD admitted for left-sided hearing loss and ear ringing.  Noted to have elevated blood pressure.  CT head unremarkable but MRI brain showed concerns of subacute CVA.  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  No obvious obstruction of your canal or retrocochlear mass noted. - Currently on aspirin , Plavix , statin - LDL 160, A1c 12.5, UDS is negative - Echocardiogram-EF 60% - Outpatient ENT and ophthalmology referral  Discussed with Dr Lennart from ENT regarding left-sided tinnitus and reduced hearing.  She recommended high-dose steroids but due to uncontrolled DM2 and risk of DKA at this time her recommendations are to follow-up outpatient ENT within the next few days of discharge.  At that time she will perform steroid injection and audiology testing.  Appreciate her input  Diabetes mellitus type 2, uncontrolled hyperglycemia - On long-acting, sliding scale and Accu-Chek.  Adjust as necessary - A1c 12.5, will consult diabetic coordinator  Essential hypertension - Permissive hypertension  Renal insufficiency - No baseline creatinine, admission 1.45  Hyperlipidemia - LDL 160, Lipitor  DVT prophylaxis: enoxaparin  (LOVENOX ) injection 40 mg Start: 07/17/24 2000      Code Status: Full Code Family Communication:   Ongoing management for acute CVA.   PT Follow up Recs:   Subjective: Seen at bedside does not have any new complaints besides still having difficulty hearing   Examination:  General exam: Appears calm and comfortable  Respiratory system: Clear to auscultation. Respiratory effort normal. Cardiovascular system: S1 & S2 heard, RRR. No JVD, murmurs, rubs, gallops or clicks. No pedal edema. Gastrointestinal system: Abdomen is  nondistended, soft and nontender. No organomegaly or masses felt. Normal bowel sounds heard. Central nervous system: Alert and oriented. No focal neurological deficits. Extremities: Symmetric 5 x 5 power. Skin: No rashes, lesions or ulcers Psychiatry: Judgement and insight appear normal. Mood & affect appropriate.                Diet Orders (From admission, onward)     Start     Ordered   07/17/24 1952  Diet heart healthy/carb modified Fluid consistency: Thin  Diet effective now       Question:  Fluid consistency:  Answer:  Thin   07/17/24 1951            Objective: Vitals:   07/18/24 0300 07/18/24 0532 07/18/24 0809 07/18/24 1126  BP: (!) 146/95 (!) 140/94 (!) 147/97 (!) 155/100  Pulse: 73 79 84 85  Resp: 12  15 17   Temp:  98.5 F (36.9 C) 98 F (36.7 C) 97.6 F (36.4 C)  TempSrc:  Oral Oral Oral  SpO2: 98% 100% 100% 100%  Weight:  84.2 kg    Height:  6' (1.829 m)     No intake or output data in the 24 hours ending 07/18/24 1532 Filed Weights   07/18/24 0532  Weight: 84.2 kg    Scheduled Meds:  aspirin  EC  81 mg Oral Daily   atorvastatin   40 mg Oral Daily   clopidogrel   75 mg Oral Daily   enoxaparin  (LOVENOX ) injection  40 mg Subcutaneous Q24H   insulin  aspart  0-5 Units Subcutaneous QHS   insulin  aspart  0-9 Units Subcutaneous TID WC   insulin  aspart  5 Units Subcutaneous TID WC  insulin  glargine  15 Units Subcutaneous Q2200   Continuous Infusions:  Nutritional status     Body mass index is 25.18 kg/m.  Data Reviewed:   CBC: Recent Labs  Lab 07/17/24 1142 07/18/24 0424  WBC 6.8 3.8*  HGB 12.3* 12.4*  HCT 34.6* 34.8*  MCV 80.3 80.2  PLT 274 255   Basic Metabolic Panel: Recent Labs  Lab 07/17/24 1142 07/18/24 0016 07/18/24 0424  NA 132*  --  135  K 4.9  --  4.1  CL 96*  --  101  CO2 25  --  25  GLUCOSE 378* 396* 280*  BUN 17  --  16  CREATININE 1.45*  --  1.34*  CALCIUM  9.3  --  8.9   GFR: Estimated Creatinine  Clearance: 73.2 mL/min (A) (by C-G formula based on SCr of 1.34 mg/dL (H)). Liver Function Tests: Recent Labs  Lab 07/17/24 1142  AST 37  ALT 23  ALKPHOS 70  BILITOT 0.4  PROT 6.9  ALBUMIN 3.7   No results for input(s): LIPASE, AMYLASE in the last 168 hours. No results for input(s): AMMONIA in the last 168 hours. Coagulation Profile: No results for input(s): INR, PROTIME in the last 168 hours. Cardiac Enzymes: No results for input(s): CKTOTAL, CKMB, CKMBINDEX, TROPONINI in the last 168 hours. BNP (last 3 results) No results for input(s): PROBNP in the last 8760 hours. HbA1C: Recent Labs    07/18/24 0424  HGBA1C 12.5*   CBG: Recent Labs  Lab 07/17/24 2208 07/18/24 0812 07/18/24 1115  GLUCAP 430* 281* 204*   Lipid Profile: Recent Labs    07/18/24 0424  CHOL 242*  HDL 54  LDLCALC 160*  TRIG 140  CHOLHDL 4.5   Thyroid Function Tests: No results for input(s): TSH, T4TOTAL, FREET4, T3FREE, THYROIDAB in the last 72 hours. Anemia Panel: No results for input(s): VITAMINB12, FOLATE, FERRITIN, TIBC, IRON, RETICCTPCT in the last 72 hours. Sepsis Labs: No results for input(s): PROCALCITON, LATICACIDVEN in the last 168 hours.  No results found for this or any previous visit (from the past 240 hours).       Radiology Studies: ECHOCARDIOGRAM COMPLETE Result Date: 07/18/2024    ECHOCARDIOGRAM REPORT   Patient Name:   Donald Carter Date of Exam: 07/18/2024 Medical Rec #:  [id number]        Height:       72.0 in Accession #:    [id number]       Weight:       185.6 lb Date of Birth:  1975-07-19       BSA:          2.064 m Patient Age:    48 years         BP:           155/100 mmHg Patient Gender: M                HR:           78 bpm. Exam Location:  Inpatient Procedure: 2D Echo, Cardiac Doppler, Color Doppler and Saline Contrast Bubble            Study (Both Spectral and Color Flow Doppler were utilized during             procedure). Indications:    Stroke  History:        Patient has no prior history of Echocardiogram examinations.  Sonographer:    Odella Brewster Referring Phys: [id number] CORTNEY E DE LA TORRE IMPRESSIONS  1. Left ventricular ejection fraction, by estimation, is 55 to 60%. The left ventricle has normal function. The left ventricle has no regional wall motion abnormalities. There is mild concentric left ventricular hypertrophy. Left ventricular diastolic parameters are consistent with Grade I diastolic dysfunction (impaired relaxation).  2. Right ventricular systolic function is normal. The right ventricular size is normal. Tricuspid regurgitation signal is inadequate for assessing PA pressure.  3. The mitral valve is grossly normal. Trivial mitral valve regurgitation. No evidence of mitral stenosis.  4. The aortic valve is tricuspid. Aortic valve regurgitation is trivial. No aortic stenosis is present.  5. The inferior vena cava is normal in size with greater than 50% respiratory variability, suggesting right atrial pressure of 3 mmHg.  6. Agitated saline contrast bubble study was negative, with no evidence of any interatrial shunt. FINDINGS  Left Ventricle: Left ventricular ejection fraction, by estimation, is 55 to 60%. The left ventricle has normal function. The left ventricle has no regional wall motion abnormalities. The left ventricular internal cavity size was normal in size. There is  mild concentric left ventricular hypertrophy. Left ventricular diastolic parameters are consistent with Grade I diastolic dysfunction (impaired relaxation). Right Ventricle: The right ventricular size is normal. No increase in right ventricular wall thickness. Right ventricular systolic function is normal. Tricuspid regurgitation signal is inadequate for assessing PA pressure. Left Atrium: Left atrial size was normal in size. Right Atrium: Right atrial size was normal in size. Pericardium: There is no evidence of pericardial  effusion. Mitral Valve: The mitral valve is grossly normal. Trivial mitral valve regurgitation. No evidence of mitral valve stenosis. MV peak gradient, 2.8 mmHg. The mean mitral valve gradient is 1.0 mmHg. Tricuspid Valve: The tricuspid valve is grossly normal. Tricuspid valve regurgitation is trivial. No evidence of tricuspid stenosis. Aortic Valve: The aortic valve is tricuspid. Aortic valve regurgitation is trivial. No aortic stenosis is present. Aortic valve mean gradient measures 5.0 mmHg. Aortic valve peak gradient measures 9.4 mmHg. Aortic valve area, by VTI measures 3.37 cm. Pulmonic Valve: The pulmonic valve was grossly normal. Pulmonic valve regurgitation is trivial. No evidence of pulmonic stenosis. Aorta: The aortic root and ascending aorta are structurally normal, with no evidence of dilitation. Venous: The right lower pulmonary vein is normal. The inferior vena cava is normal in size with greater than 50% respiratory variability, suggesting right atrial pressure of 3 mmHg. IAS/Shunts: The atrial septum is grossly normal. Agitated saline contrast was given intravenously to evaluate for intracardiac shunting. Agitated saline contrast bubble study was negative, with no evidence of any interatrial shunt.  LEFT VENTRICLE PLAX 2D LVIDd:         4.80 cm      Diastology LVIDs:         3.30 cm      LV e' medial:    5.77 cm/s LV PW:         1.50 cm      LV E/e' medial:  10.5 LV IVS:        1.10 cm      LV e' lateral:   11.20 cm/s LVOT diam:     2.40 cm      LV E/e' lateral: 5.4 LV SV:         91 LV SV Index:   44 LVOT Area:     4.52 cm LV IVRT:       99 msec  LV Volumes (MOD) LV vol d, MOD A2C: 143.0 ml LV vol d, MOD A4C:  125.0 ml LV vol s, MOD A2C: 54.0 ml LV vol s, MOD A4C: 56.1 ml LV SV MOD A2C:     89.0 ml LV SV MOD A4C:     125.0 ml LV SV MOD BP:      78.6 ml RIGHT VENTRICLE             IVC RV S prime:     12.50 cm/s  IVC diam: 1.10 cm TAPSE (M-mode): 2.5 cm                             PULMONARY VEINS                              Diastolic Velocity: 39.60 cm/s                             S/D Velocity:       1.20                             Systolic Velocity:  48.40 cm/s LEFT ATRIUM             Index        RIGHT ATRIUM           Index LA diam:        3.80 cm 1.84 cm/m   RA Area:     12.20 cm LA Vol (A2C):   53.1 ml 25.73 ml/m  RA Volume:   26.10 ml  12.64 ml/m LA Vol (A4C):   36.6 ml 17.73 ml/m LA Biplane Vol: 45.8 ml 22.19 ml/m  AORTIC VALVE                     PULMONIC VALVE AV Area (Vmax):    3.58 cm      PV Vmax:       0.98 m/s AV Area (Vmean):   3.64 cm      PV Peak grad:  3.9 mmHg AV Area (VTI):     3.37 cm AV Vmax:           153.00 cm/s AV Vmean:          110.000 cm/s AV VTI:            0.270 m AV Peak Grad:      9.4 mmHg AV Mean Grad:      5.0 mmHg LVOT Vmax:         121.00 cm/s LVOT Vmean:        88.400 cm/s LVOT VTI:          0.201 m LVOT/AV VTI ratio: 0.74  AORTA Ao Root diam: 3.40 cm Ao Asc diam:  3.40 cm MITRAL VALVE MV Area (PHT): 4.01 cm    SHUNTS MV Area VTI:   4.61 cm    Systemic VTI:  0.20 m MV Peak grad:  2.8 mmHg    Systemic Diam: 2.40 cm MV Mean grad:  1.0 mmHg MV Vmax:       0.83 m/s MV Vmean:      57.2 cm/s MV Decel Time: 189 msec MV E velocity: 60.80 cm/s MV A velocity: 94.90 cm/s MV E/A ratio:  0.64 Darryle Decent MD Electronically signed by Darryle Decent MD Signature Date/Time: 07/18/2024/1:44:56 PM    Final  CT ANGIO HEAD NECK W WO CM Result Date: 07/18/2024 EXAM: CTA Head and Neck with Intravenous Contrast. CT Head without Contrast. CLINICAL HISTORY: Stroke/TIA, determine embolic source. Acute/subacute infarcts involving the left lentiform nucleus. TECHNIQUE: Axial CTA images of the head and neck performed with intravenous contrast. MIP reconstructed images were created and reviewed. Axial computed tomography images of the head/brain performed without intravenous contrast. Note: Per PQRS, the description of internal carotid artery percent stenosis, including 0 percent or  normal exam, is based on [employer] American Symptomatic Carotid Endarterectomy Trial (NASCET) criteria. Dose reduction technique was used including one or more of the following: automated exposure control, adjustment of mA and kV according to patient size, and/or iterative reconstruction. CONTRAST: With; 75 mL (iohexol  (OMNIPAQUE ) 350 MG/ML injection 75 mL IOHEXOL  350 MG/ML SOLN). COMPARISON: None provided. FINDINGS: CT HEAD: BRAIN: Noncontrast head images demonstrate a subacute left basal ganglia infarct. No acute intraparenchymal hemorrhage. No mass lesion. No CT evidence for acute territorial infarct. No midline shift or extra-axial collection. VENTRICLES: No hydrocephalus. ORBITS: The orbits are unremarkable. SINUSES AND MASTOIDS: The paranasal sinuses and mastoid air cells are clear. CTA NECK: COMMON CAROTID ARTERIES: Common origin of the left common carotid artery and the innominate artery was noted, a normal variant. No significant stenosis. No dissection or occlusion. INTERNAL CAROTID ARTERIES: No stenosis by NASCET criteria. No dissection or occlusion. VERTEBRAL ARTERIES: The right vertebral artery is dominant. The left vertebral artery originates directly from the aortic arch, a normal variant. No significant stenosis. No dissection or occlusion. CTA HEAD: ANTERIOR CEREBRAL ARTERIES: No significant stenosis. No occlusion. No aneurysm. MIDDLE CEREBRAL ARTERIES: No significant stenosis. No occlusion. No aneurysm. POSTERIOR CEREBRAL ARTERIES: A right fetal type posterior cerebral artery is present. The left posterior cerebral artery originates from the left P1 segment and the left posterior communicating artery. The left posterior communicating artery is occluded. PCA branch vessels are within normal limits bilaterally. BASILAR ARTERY: The basilar artery is small. No significant stenosis. No occlusion. No aneurysm. OTHER: SOFT TISSUES: No acute finding. No masses or lymphadenopathy. BONES: No acute osseous  abnormality. IMPRESSION: 1. Acute/subacute infarcts involving the left lentiform nucleus. 2. 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 Electronically signed by: Lonni Necessary MD 07/18/2024 11:46 AM EST RP Workstation: [id number]   MR BRAIN/IAC W WO CONTRAST Result Date: 07/17/2024 EXAM: MR Internal Auditory Canals without and with Contrast 07/17/2024 03:11:00 PM TECHNIQUE: Multiplanar, multisequence MRI of the internal auditory canals was performed without and with administration of intravenous contrast. COMPARISON: MRI HEAD from 06/22/2016. CLINICAL HISTORY: Hearing loss ICD-10 Code: 357797 FINDINGS: INTERNAL AUDITORY CANALS: No mass or abnormal enhancement. Inner ear structures are unremarkable. MASTOIDS AND MIDDLE EARS: Clear. BRAIN AND BRAINSTEM: Two small foci of restricted diffusion in the left basal ganglia including an area with peripheral enhancement for example, c-series 3, images 21 and 28 and series 14, image 76. Mild edema without mass effect. Additional punctate focus of enhancement in the right basal ganglia (series 14 image 70) without associated restricted diffusion. No edema there. Remote right thalamic lacunar infarct. SINUSES: No acute abnormality. BONES AND SOFT TISSUES: Normal bone marrow signal. IMPRESSION: 1. Two small foci of restricted diffusion in the left basal ganglia including an area with peripheral enhancement, most likely subacute enhancing perforator infarcts. 2. Additional punctate focus of enhancement in the right basal ganglia without associated restricted diffusion. While nonspecific, given the above  findings this most likely represents an additional enhancing subacute infarct. The distribution and appearance is atypical for demyelination or metastases. Atypical infection (such as cryptococcosis) is also less likely but should be  considered if the patient is immunocompromised. 3. A short-interval follow up MRI head with contrast in 4-6 weeks is recommended to help ensure expected evolution of the above findings. 4. No retrocochlear mass. 5. Remote right thalamic lacunar infarct. Electronically signed by: Glendia Molt MD 07/17/2024 03:32 PM EST RP Workstation: [id number]   CT Head Wo Contrast Result Date: 07/17/2024 EXAM: CT HEAD WITHOUT CONTRAST 07/17/2024 01:10:52 PM TECHNIQUE: CT of the head was performed without the administration of intravenous contrast. Automated exposure control, iterative reconstruction, and/or weight based adjustment of the mA/kV was utilized to reduce the radiation dose to as low as reasonably achievable. COMPARISON: None available. CLINICAL HISTORY: Neuro deficit, acute, stroke suspected; acute L hearing loss FINDINGS: BRAIN AND VENTRICLES: No acute hemorrhage. No evidence of acute infarct. No hydrocephalus. No extra-axial collection. No mass effect or midline shift. ORBITS: No acute abnormality. SINUSES: Right maxillary sinus mucosal thickening. SOFT TISSUES AND SKULL: No acute soft tissue abnormality. No skull fracture. IMPRESSION: 1. No acute intracranial abnormality. Electronically signed by: Ryan Chess MD 07/17/2024 01:16 PM EST RP Workstation: [id number]           LOS: 0 days   Time spent= 35 mins    Burgess JAYSON Dare, MD [facility]  If 7PM-7AM, please contact night-coverage  07/18/2024, 3:32 PM  "

## 2024-07-18 NOTE — Evaluation (Signed)
 Physical Therapy Evaluation & Discharge Patient Details Name: Donald Carter MRN: [id number] DOB: [birth year] Today's Date: 07/18/2024  History of Present Illness  Pt is a 49 y/o male presenting on 07/17/24 with tinnitus, pressure and hearing loss in L ear. CT negative.  MRI with 2 small foci of restricted diffusion in L basal ganglia with peripheral enhancement, additional punctate focus in R basal ganglia; remote R thalamic lacunar infarct. PMH includes: DM, HTN.   Clinical Impression  Pt greeted supine in bed, pleasant and agreeable to PT evaluation. PTA, pt was independent with functional mobility, ADLs/IADLs, driving, and working. He lives with his girlfriend in an apartment with 1 STE at back without rail and 5-6 STE at front with railings. Pt demonstrated BLE strength, sensation, and coordination WFL. He completed the DGI which assessed his ability to maintain walking balance while responding to different task demands, through various dynamic conditions. Pt scored a 22/24, which is above the cut-off for increased fall risk (<19/24). He ambulated ~223ft with a reciprocal gait pattern. Pt ascended/descended a flight of stairs with unilateral UE support. He appears to be very close to his baseline function. Pt denied dizziness/lightheadedness with VSS. He c/o tinnitus in his left ear. No further acute PT needs identified, PT will sign off.     If plan is discharge home, recommend the following:     Can travel by private vehicle        Equipment Recommendations None recommended by PT  Recommendations for Other Services       Functional Status Assessment Patient has not had a recent decline in their functional status     Precautions / Restrictions Precautions Precautions: Fall Recall of Precautions/Restrictions: Intact Precaution/Restrictions Comments: Blood Pressure Goal: less than 220/110 Restrictions Weight Bearing Restrictions Per Provider Order: No      Mobility  Bed  Mobility Overal bed mobility: Modified Independent             General bed mobility comments: HOB elevated ~30deg.    Transfers Overall transfer level: Modified independent Equipment used: None               General transfer comment: Pt performed sit<>stand from lowest bed height.    Ambulation/Gait Ambulation/Gait assistance: Supervision, Modified independent (Device/Increase time) Gait Distance (Feet): 250 Feet Assistive device: None Gait Pattern/deviations: Step-through pattern Gait velocity: WFL Gait velocity interpretation: 1.31 - 2.62 ft/sec, indicative of limited community ambulator   General Gait Details: Pt ambulated with a reciporcal gait pattern, even weight shift, and reduced foot clearence bilaterally. He navigated room/hallway well, no LOB.  Stairs Stairs: Yes Stairs assistance: Supervision Stair Management: One rail Right, Forwards, Alternating pattern Number of Stairs: 10 General stair comments: Pt ascended/descended a flight of stairs with a reciprocal gait pattern and unilateral UE support. No LOB.  Wheelchair Mobility     Tilt Bed    Modified Rankin (Stroke Patients Only) Modified Rankin (Stroke Patients Only) Pre-Morbid Rankin Score: No symptoms Modified Rankin: No symptoms     Balance                                 Standardized Balance Assessment Standardized Balance Assessment : Dynamic Gait Index   Dynamic Gait Index Level Surface: Normal Change in Gait Speed: Normal Gait with Horizontal Head Turns: Normal Gait with Vertical Head Turns: Normal Gait and Pivot Turn: Normal Step Over Obstacle: Mild Impairment Step Around Obstacles: Normal Steps: Mild  Impairment Total Score: 22       Pertinent Vitals/Pain Pain Assessment Pain Assessment: No/denies pain    Home Living Family/patient expects to be discharged to:: Private residence Living Arrangements: Spouse/significant other (Girlfriend) Available Help at  Discharge: Family;Available 24 hours/day Type of Home: Apartment Home Access: Stairs to enter Entrance Stairs-Rails: Can reach both;None (in front; none at the back) Entrance Stairs-Number of Steps: 1 back (usually goes this way) , 5-6 front   Home Layout: One level Home Equipment: None      Prior Function Prior Level of Function : Independent/Modified Independent;Driving;Working/employed             Mobility Comments: Ambulates without AD. Denies fall hx. ADLs Comments: Indep with ADLs/IADLs. 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)     Extremity/Trunk Assessment   Upper Extremity Assessment Upper Extremity Assessment: Defer to OT evaluation    Lower Extremity Assessment Lower Extremity Assessment: Overall WFL for tasks assessed    Cervical / Trunk Assessment Cervical / Trunk Assessment: Normal  Communication   Communication Communication: Impaired Factors Affecting Communication: Hearing impaired (loss of hearing L ear)    Cognition Arousal: Alert Behavior During Therapy: WFL for tasks assessed/performed   PT - Cognitive impairments: No apparent impairments                       PT - Cognition Comments: Pt A,Ox4 Following commands: Intact       Cueing Cueing Techniques: Verbal cues     General Comments General comments (skin integrity, edema, etc.): VSS on RA. Pt c/o tinnitus in his left ear with decreased hearing, characterizing the sound as being like radio static. He denied dizziness/lightheadedness.    Exercises     Assessment/Plan    PT Assessment Patient does not need any further PT services  PT Problem List         PT Treatment Interventions      PT Goals (Current goals can be found in the Care Plan section)  Acute Rehab PT Goals PT Goal Formulation: All assessment and education complete, DC therapy    Frequency       Co-evaluation               AM-PAC PT 6 Clicks Mobility  Outcome Measure Help needed turning  from your back to your side while in a flat bed without using bedrails?: None Help needed moving from lying on your back to sitting on the side of a flat bed without using bedrails?: None Help needed moving to and from a bed to a chair (including a wheelchair)?: None Help needed standing up from a chair using your arms (e.g., wheelchair or bedside chair)?: None Help needed to walk in hospital room?: None Help needed climbing 3-5 steps with a railing? : A Little 6 Click Score: 23    End of Session Equipment Utilized During Treatment: Gait belt Activity Tolerance: Patient tolerated treatment well Patient left: in bed Nurse Communication: Mobility status PT Visit Diagnosis: Other abnormalities of gait and mobility (R26.89)    Time: 8589-8571 PT Time Calculation (min) (ACUTE ONLY): 18 min   Charges:   PT Evaluation $PT Eval Low Complexity: 1 Low PT Treatments $Gait Training: 8-22 mins PT General Charges $$ ACUTE PT VISIT: 1 Visit         Randall SAUNDERS, PT, DPT Acute Rehabilitation Services Office: [phone] Secure Chat Preferred  Delon CHRISTELLA Callander 07/18/2024, 2:38 PM

## 2024-07-18 NOTE — Progress Notes (Signed)
 Ordering 30 day monitor, post stroke, at request of Neurology. Results to Dr. Nancey

## 2024-07-18 NOTE — Evaluation (Signed)
 Speech Language Pathology Evaluation Patient Details Name: Donald Carter MRN: [id number] DOB: 1976/07/02 Today's Date: 07/18/2024 Time: 8973-8963 SLP Time Calculation (min) (ACUTE ONLY): 10 min  Problem List:  Patient Active Problem List   Diagnosis Date Noted   CVA (cerebral vascular accident) (HCC) 07/17/2024   Acute hearing loss of left ear 07/17/2024   Hyperlipidemia associated with type 2 diabetes mellitus (HCC) 05/24/2019   Type 2 diabetes mellitus (HCC) 05/22/2019   Hypertension associated with diabetes (HCC) 05/22/2019   Past Medical History:  Past Medical History:  Diagnosis Date   Diabetes mellitus without complication (HCC)    Hypertension    Past Surgical History:  Past Surgical History:  Procedure Laterality Date   FINGER EXPLORATION     Rt index finger   INCISION AND DRAINAGE Right 02/21/2017   Procedure: INCISION AND DRAINAGE WITH EXCISION PYOGENIC GRANULOMA RIGHT INDEX FINGER;  Surgeon: Murrell Drivers, MD;  Location: [facility];  Service: Orthopedics;  Laterality: Right;  Bier block with MAC   HPI:  Pt is a 49 y/o male presenting on 07/17/24 with tinnitus, pressure and hearing loss in L ear. CT negative.  MRI with 2 small foci of restricted diffusion in L basal ganglia with peripheral enhancement, additional punctate focus in R basal ganglia; remote R thalamic lacunar infarct. PMH includes: DM, HTN.   Assessment / Plan / Recommendation Clinical Impression  Donald Carter presents with normal speech, fluency, expression and comprehension. Cognition is WNL. He plans to f/u with ENT s/p D/C to address hearing loss. No SLP F/u needed.    SLP Assessment  SLP Recommendation/Assessment: Patient does not need any further Speech Language Pathology Services     Assistance Recommended at Discharge  None                 SLP Evaluation Cognition  Overall Cognitive Status: Within Functional Limits for tasks assessed Arousal/Alertness:  Awake/alert Orientation Level: Oriented X4 Attention: Alternating Alternating Attention: Appears intact       Comprehension  Auditory Comprehension Overall Auditory Comprehension: Appears within functional limits for tasks assessed Reading Comprehension Reading Status: Within funtional limits    Expression Expression Primary Mode of Expression: Verbal Verbal Expression Overall Verbal Expression: Appears within functional limits for tasks assessed Written Expression Written Expression: Not tested   Oral / Motor  Oral Motor/Sensory Function Overall Oral Motor/Sensory Function: Within functional limits Motor Speech Overall Motor Speech: Appears within functional limits for tasks assessed            Donald Carter 07/18/2024, 10:45 AM Donald L. Vona, MA CCC/SLP Clinical Specialist - Acute Care SLP Acute Rehabilitation Services Office number [phone]

## 2024-07-19 ENCOUNTER — Other Ambulatory Visit (HOSPITAL_COMMUNITY): Payer: Self-pay

## 2024-07-19 DIAGNOSIS — I63039 Cerebral infarction due to thrombosis of unspecified carotid artery: Secondary | ICD-10-CM | POA: Diagnosis not present

## 2024-07-19 LAB — CBC
HCT: 33.4 % — ABNORMAL LOW (ref 39.0–52.0)
Hemoglobin: 12 g/dL — ABNORMAL LOW (ref 13.0–17.0)
MCH: 28.1 pg (ref 26.0–34.0)
MCHC: 35.9 g/dL (ref 30.0–36.0)
MCV: 78.2 fL — ABNORMAL LOW (ref 80.0–100.0)
Platelets: 250 K/uL (ref 150–400)
RBC: 4.27 MIL/uL (ref 4.22–5.81)
RDW: 12 % (ref 11.5–15.5)
WBC: 4.1 K/uL (ref 4.0–10.5)
nRBC: 0 % (ref 0.0–0.2)

## 2024-07-19 LAB — BASIC METABOLIC PANEL WITH GFR
Anion gap: 9 (ref 5–15)
BUN: 17 mg/dL (ref 6–20)
CO2: 27 mmol/L (ref 22–32)
Calcium: 8.6 mg/dL — ABNORMAL LOW (ref 8.9–10.3)
Chloride: 102 mmol/L (ref 98–111)
Creatinine, Ser: 1.55 mg/dL — ABNORMAL HIGH (ref 0.61–1.24)
GFR, Estimated: 55 mL/min — ABNORMAL LOW
Glucose, Bld: 175 mg/dL — ABNORMAL HIGH (ref 70–99)
Potassium: 3.9 mmol/L (ref 3.5–5.1)
Sodium: 138 mmol/L (ref 135–145)

## 2024-07-19 LAB — GLUCOSE, CAPILLARY: Glucose-Capillary: 157 mg/dL — ABNORMAL HIGH (ref 70–99)

## 2024-07-19 LAB — PHOSPHORUS: Phosphorus: 4.5 mg/dL (ref 2.5–4.6)

## 2024-07-19 LAB — MAGNESIUM: Magnesium: 1.9 mg/dL (ref 1.7–2.4)

## 2024-07-19 MED ORDER — INSULIN GLARGINE 100 UNIT/ML ~~LOC~~ SOLN
18.0000 [IU] | Freq: Every day | SUBCUTANEOUS | Status: DC
  Filled 2024-07-19: qty 0.18

## 2024-07-19 MED ORDER — BLOOD GLUCOSE MONITOR SYSTEM W/DEVICE KIT
1.0000 | PACK | 0 refills | Status: AC
  Filled 2024-07-19: qty 1, 1d supply, fill #0

## 2024-07-19 MED ORDER — INSULIN GLARGINE-YFGN 100 UNIT/ML ~~LOC~~ SOPN
18.0000 [IU] | PEN_INJECTOR | Freq: Every day | SUBCUTANEOUS | 0 refills | Status: AC
  Filled 2024-07-19: qty 6, 33d supply, fill #0

## 2024-07-19 MED ORDER — INSULIN ASPART 100 UNIT/ML FLEXPEN
5.0000 [IU] | PEN_INJECTOR | Freq: Three times a day (TID) | SUBCUTANEOUS | 0 refills | Status: AC
  Filled 2024-07-19: qty 15, 45d supply, fill #0

## 2024-07-19 MED ORDER — LANCET DEVICE MISC
1.0000 | 0 refills | Status: AC
  Filled 2024-07-19: qty 1, fill #0

## 2024-07-19 MED ORDER — ATORVASTATIN CALCIUM 40 MG PO TABS
40.0000 mg | ORAL_TABLET | Freq: Every day | ORAL | 0 refills | Status: AC
  Filled 2024-07-19: qty 90, 90d supply, fill #0

## 2024-07-19 MED ORDER — CLOPIDOGREL BISULFATE 75 MG PO TABS
75.0000 mg | ORAL_TABLET | Freq: Every day | ORAL | 0 refills | Status: AC
  Filled 2024-07-19: qty 21, 21d supply, fill #0

## 2024-07-19 MED ORDER — INSULIN PEN NEEDLE 32G X 4 MM MISC
1.0000 | Freq: Four times a day (QID) | 0 refills | Status: AC
  Filled 2024-07-19: qty 100, 25d supply, fill #0

## 2024-07-19 MED ORDER — BLOOD GLUCOSE TEST VI STRP
1.0000 | ORAL_STRIP | 0 refills | Status: AC
  Filled 2024-07-19: qty 100, 25d supply, fill #0

## 2024-07-19 MED ORDER — ONETOUCH DELICA PLUS LANCET33G MISC
1.0000 | 0 refills | Status: AC
  Filled 2024-07-19: qty 100, 25d supply, fill #0

## 2024-07-19 MED ORDER — ASPIRIN 81 MG PO TBEC
81.0000 mg | DELAYED_RELEASE_TABLET | Freq: Every day | ORAL | 0 refills | Status: AC
  Filled 2024-07-19: qty 90, 90d supply, fill #0

## 2024-07-19 NOTE — Plan of Care (Signed)
" °  Problem: Education: Goal: Knowledge of disease or condition will improve Outcome: Progressing Goal: Knowledge of secondary prevention will improve (MUST DOCUMENT ALL) Outcome: Progressing Goal: Knowledge of patient specific risk factors will improve (DELETE if not current risk factor) Outcome: Progressing   Problem: Ischemic Stroke/TIA Tissue Perfusion: Goal: Complications of ischemic stroke/TIA will be minimized Outcome: Progressing   Problem: Coping: Goal: Will identify appropriate support needs Outcome: Progressing   Problem: Nutrition: Goal: Risk of aspiration will decrease Outcome: Progressing   "

## 2024-07-19 NOTE — Discharge Summary (Signed)
 Physician Discharge Summary  Donald Carter FMW:[id number] DOB: June 27, 1976 DOA: 07/17/2024  PCP: Vicci Barnie NOVAK, MD  Admit date: 07/17/2024 Discharge date: 07/19/2024  Admitted From: Home Disposition: Home  Recommendations for Outpatient Follow-up:  Follow up with PCP in 1-2 weeks Please obtain BMP/CBC in one week your next doctors visit.  Advised to get outpatient endocrinology referral Patient will be on aspirin  and Plavix  for 21 days followed by aspirin  Prescribe Lantus  18 units daily, NovoLog  5 units Premeal along with sliding scale.  Discharge Condition: Stable CODE STATUS: Full code Diet recommendation: Diabetic  Brief/Interim Summary: Brief Narrative:   49 year old with history of DM2, HTN, HLD admitted for left-sided hearing loss and ear ringing.  Noted to have elevated blood pressure.  CT head unremarkable but MRI brain showed concerns of subacute CVA.  Patient was seen by neurology team.  CTA head and neck showed occlusion of left PCOM.  MRI consistent with subacute infarct.  Echocardiogram showed preserved EF.  LDL around 60, A1c 12.5.  Neurology recommended aspirin  Plavix  for 3 weeks followed by aspirin  monotherapy and increasing Lipitor 40 mg.  30-day cardiac event monitor was arranged.  No further recommendations from physical therapy. For tinnitus, ENT recommended outpatient follow-up later this coming week.  Medically stable for discharge  Assessment & Plan:  Acute/subacute CVA Tinnitus - MRI consistent with subacute infarct.  Echocardiogram showed preserved EF.  LDL around 60, A1c 12.5.  Neurology recommended aspirin  Plavix  for 3 weeks followed by aspirin  monotherapy and increasing Lipitor 40 mg.  30-day cardiac event monitor was arranged.  No further recommendations from physical therapy.  Discussed with Dr Lennart from ENT regarding left-sided tinnitus and reduced hearing.  She recommended high-dose steroids but due to uncontrolled DM2 and risk of DKA at  this time her recommendations are to follow-up outpatient ENT within the next few days of discharge.  At that time she will perform steroid injection and audiology testing.  Appreciate her input  Diabetes mellitus type 2, uncontrolled hyperglycemia - A1c 12.5.  Blood sugar was fairly better controlled overnight.  Will discharge him on Lantus  18 units daily, NovoLog  5 units Premeal and sliding scale.  He will need to follow-up outpatient.  Essential hypertension - Permissive hypertension.  Currently not taking any home medication.  Advised to continue monitoring this and follow-up outpatient with PCP to be placed on scheduled medication depending on his blood pressure trend.  Ideally he should be on an ARB  Renal insufficiency - No baseline creatinine, admission 1.45  Hyperlipidemia - LDL 160, Lipitor  DVT prophylaxis: enoxaparin  (LOVENOX ) injection 40 mg Start: 07/17/24 2000      Code Status: Full Code Family Communication:   Ongoing management for acute CVA.   PT Follow up Recs:   Subjective: Seen at bedside does not have any new complaints besides still having difficulty hearing Overall feeling much better wishing to go home  Examination:  General exam: Appears calm and comfortable  Respiratory system: Clear to auscultation. Respiratory effort normal. Cardiovascular system: S1 & S2 heard, RRR. No JVD, murmurs, rubs, gallops or clicks. No pedal edema. Gastrointestinal system: Abdomen is nondistended, soft and nontender. No organomegaly or masses felt. Normal bowel sounds heard. Central nervous system: Alert and oriented. No focal neurological deficits. Extremities: Symmetric 5 x 5 power. Skin: No rashes, lesions or ulcers Psychiatry: Judgement and insight appear normal. Mood & affect appropriate.    Discharge Diagnoses:  Principal Problem:   CVA (cerebral vascular accident) [facility]) Active Problems:   Acute  hearing loss of left ear   Type 2 diabetes mellitus (HCC)    Hypertension associated with diabetes (HCC)   Hyperlipidemia associated with type 2 diabetes mellitus (HCC)      Discharge Exam: Vitals:   07/19/24 0358 07/19/24 0735  BP: 136/89 (!) 137/95  Pulse: 71 82  Resp: 17 17  Temp: 98.8 F (37.1 C) 99.4 F (37.4 C)  SpO2: 100% 100%   Vitals:   07/18/24 2030 07/19/24 0005 07/19/24 0358 07/19/24 0735  BP: (!) 138/92 (!) 141/94 136/89 (!) 137/95  Pulse: 80 79 71 82  Resp: 18 17 17 17   Temp: (!) 97.5 F (36.4 C) 97.8 F (36.6 C) 98.8 F (37.1 C) 99.4 F (37.4 C)  TempSrc: Oral Oral Oral Oral  SpO2: 100% 99% 100% 100%  Weight:      Height:          Discharge Instructions  Discharge Instructions     Amb Referral to Nutrition and Diabetic Education   Complete by: As directed    Ambulatory referral to Neurology   Complete by: As directed    Follow up with stroke clinic NP at [facility] in about 4-6 weeks. Thanks.      Allergies as of 07/19/2024   No Known Allergies      Medication List     STOP taking these medications    amLODipine  10 MG tablet Commonly known as: NORVASC    Lantus  SoloStar 100 UNIT/ML Solostar Pen Generic drug: insulin  glargine   lisinopril  10 MG tablet Commonly known as: ZESTRIL    metFORMIN  1000 MG tablet Commonly known as: GLUCOPHAGE    True Metrix Blood Glucose Test test strip Generic drug: glucose blood Replaced by: BLOOD GLUCOSE TEST STRIPS Strp   VITAMIN C PO   VITAMIN D (CHOLECALCIFEROL) PO       TAKE these medications    aspirin  EC 81 MG tablet Take 1 tablet (81 mg total) by mouth daily. Swallow whole.   atorvastatin  40 MG tablet Commonly known as: LIPITOR Take 1 tablet (40 mg total) by mouth daily. What changed:  medication strength how much to take   Blood Glucose Monitor System w/Device Kit Dispense based on patient and insurance preference. Use up to four times daily as directed. (FOR ICD-10 E10.9, E11.9).   BLOOD GLUCOSE TEST STRIPS Strp 1 each by Does not apply  route as directed. Dispense based on patient and insurance preference. Use up to four times daily as directed. (FOR ICD-10 E10.9, E11.9). Replaces: True Metrix Blood Glucose Test test strip   clopidogrel  75 MG tablet Commonly known as: PLAVIX  Take 1 tablet (75 mg total) by mouth daily for 21 days.   insulin  aspart 100 UNIT/ML FlexPen Commonly known as: NOVOLOG  Inject 5 Units into the skin 3 (three) times daily with meals. If eating and Blood Glucose (BG) 80 or higher inject 5 units for meal coverage and add correction dose per scale. If not eating, correction dose only. BG <150= 0 unit; BG 150-200= 1 unit; BG 201-250= 3 unit; BG 251-300= 5 unit; BG 301-350= 7 unit; BG 351-400= 9 unit; BG >400= 11 unit and Call Primary Care.   insulin  glargine-yfgn 100 UNIT/ML Pen Commonly known as: SEMGLEE  Inject 18 Units into the skin daily. May substitute as needed per insurance.   Lancet Device Misc 1 each by Does not apply route as directed. Dispense based on patient and insurance preference. Use up to four times daily as directed. (FOR ICD-10 E10.9, E11.9).   OneTouch Delica Plus  Lancet33G Misc Dispense based on patient and insurance preference. Use up to four times daily as directed. (FOR ICD-10 E10.9, E11.9).        Follow-up Information      [facility]. Schedule an appointment as soon as possible for a visit in 1 month(s).   Specialty: Neurology Why: stroke clinic Contact information: [address] [place]  [address] [phone]        Greggory Hadassah BROCKS, MD. Call.   Specialty: Otolaryngology Why: Needs Appt This coming week, Discussed with Dr Greggory. Contact information: [address] [place] [address] [phone]                Allergies[1]  You were cared for by a hospitalist during your hospital stay. If you have any questions about your discharge medications or the care you received while you  were in the hospital after you are discharged, you can call the unit and asked to speak with the hospitalist on call if the hospitalist that took care of you is not available. Once you are discharged, your primary care physician will handle any further medical issues. Please note that no refills for any discharge medications will be authorized once you are discharged, as it is imperative that you return to your primary care physician (or establish a relationship with a primary care physician if you do not have one) for your aftercare needs so that they can reassess your need for medications and monitor your lab values.  You were cared for by a hospitalist during your hospital stay. If you have any questions about your discharge medications or the care you received while you were in the hospital after you are discharged, you can call the unit and asked to speak with the hospitalist on call if the hospitalist that took care of you is not available. Once you are discharged, your primary care physician will handle any further medical issues. Please note that NO REFILLS for any discharge medications will be authorized once you are discharged, as it is imperative that you return to your primary care physician (or establish a relationship with a primary care physician if you do not have one) for your aftercare needs so that they can reassess your need for medications and monitor your lab values.  Please request your Prim.MD to go over all Hospital Tests and Procedure/Radiological results at the follow up, please get all Hospital records sent to your Prim MD by signing hospital release before you go home.  Get CBC, CMP, 2 view Chest X ray checked  by Primary MD during your next visit or SNF MD in 5-7 days ( we routinely change or add medications that can affect your baseline labs and fluid status, therefore we recommend that you get the mentioned basic workup next visit with your PCP, your PCP may decide not to get  them or add new tests based on their clinical decision)  On your next visit with your primary care physician please Get Medicines reviewed and adjusted.  If you experience worsening of your admission symptoms, develop shortness of breath, life threatening emergency, suicidal or homicidal thoughts you must seek medical attention immediately by calling 911 or calling your MD immediately  if symptoms less severe.  You Must read complete instructions/literature along with all the possible adverse reactions/side effects for all the Medicines you take and that have been prescribed to you. Take any new Medicines after you have completely understood and accpet  all the possible adverse reactions/side effects.   Do not drive, operate heavy machinery, perform activities at heights, swimming or participation in water activities or provide baby sitting services if your were admitted for syncope or siezures until you have seen by Primary MD or a Neurologist and advised to do so again.  Do not drive when taking Pain medications.   Procedures/Studies: ECHOCARDIOGRAM COMPLETE Result Date: 07/18/2024    ECHOCARDIOGRAM REPORT   Patient Name:   Donald Carter Date of Exam: 07/18/2024 Medical Rec #:  [id number]        Height:       72.0 in Accession #:    [id number]       Weight:       185.6 lb Date of Birth:  1975/09/23       BSA:          2.064 m Patient Age:    48 years         BP:           155/100 mmHg Patient Gender: M                HR:           78 bpm. Exam Location:  Inpatient Procedure: 2D Echo, Cardiac Doppler, Color Doppler and Saline Contrast Bubble            Study (Both Spectral and Color Flow Doppler were utilized during            procedure). Indications:    Stroke  History:        Patient has no prior history of Echocardiogram examinations.  Sonographer:    Odella Brewster Referring Phys: [id number] CORTNEY E DE LA TORRE IMPRESSIONS  1. Left ventricular ejection fraction, by estimation, is 55 to 60%. The  left ventricle has normal function. The left ventricle has no regional wall motion abnormalities. There is mild concentric left ventricular hypertrophy. Left ventricular diastolic parameters are consistent with Grade I diastolic dysfunction (impaired relaxation).  2. Right ventricular systolic function is normal. The right ventricular size is normal. Tricuspid regurgitation signal is inadequate for assessing PA pressure.  3. The mitral valve is grossly normal. Trivial mitral valve regurgitation. No evidence of mitral stenosis.  4. The aortic valve is tricuspid. Aortic valve regurgitation is trivial. No aortic stenosis is present.  5. The inferior vena cava is normal in size with greater than 50% respiratory variability, suggesting right atrial pressure of 3 mmHg.  6. Agitated saline contrast bubble study was negative, with no evidence of any interatrial shunt. FINDINGS  Left Ventricle: Left ventricular ejection fraction, by estimation, is 55 to 60%. The left ventricle has normal function. The left ventricle has no regional wall motion abnormalities. The left ventricular internal cavity size was normal in size. There is  mild concentric left ventricular hypertrophy. Left ventricular diastolic parameters are consistent with Grade I diastolic dysfunction (impaired relaxation). Right Ventricle: The right ventricular size is normal. No increase in right ventricular wall thickness. Right ventricular systolic function is normal. Tricuspid regurgitation signal is inadequate for assessing PA pressure. Left Atrium: Left atrial size was normal in size. Right Atrium: Right atrial size was normal in size. Pericardium: There is no evidence of pericardial effusion. Mitral Valve: The mitral valve is grossly normal. Trivial mitral valve regurgitation. No evidence of mitral valve stenosis. MV peak gradient, 2.8 mmHg. The mean mitral valve gradient is 1.0 mmHg. Tricuspid Valve: The tricuspid valve is grossly normal. Tricuspid valve  regurgitation  is trivial. No evidence of tricuspid stenosis. Aortic Valve: The aortic valve is tricuspid. Aortic valve regurgitation is trivial. No aortic stenosis is present. Aortic valve mean gradient measures 5.0 mmHg. Aortic valve peak gradient measures 9.4 mmHg. Aortic valve area, by VTI measures 3.37 cm. Pulmonic Valve: The pulmonic valve was grossly normal. Pulmonic valve regurgitation is trivial. No evidence of pulmonic stenosis. Aorta: The aortic root and ascending aorta are structurally normal, with no evidence of dilitation. Venous: The right lower pulmonary vein is normal. The inferior vena cava is normal in size with greater than 50% respiratory variability, suggesting right atrial pressure of 3 mmHg. IAS/Shunts: The atrial septum is grossly normal. Agitated saline contrast was given intravenously to evaluate for intracardiac shunting. Agitated saline contrast bubble study was negative, with no evidence of any interatrial shunt.  LEFT VENTRICLE PLAX 2D LVIDd:         4.80 cm      Diastology LVIDs:         3.30 cm      LV e' medial:    5.77 cm/s LV PW:         1.50 cm      LV E/e' medial:  10.5 LV IVS:        1.10 cm      LV e' lateral:   11.20 cm/s LVOT diam:     2.40 cm      LV E/e' lateral: 5.4 LV SV:         91 LV SV Index:   44 LVOT Area:     4.52 cm LV IVRT:       99 msec  LV Volumes (MOD) LV vol d, MOD A2C: 143.0 ml LV vol d, MOD A4C: 125.0 ml LV vol s, MOD A2C: 54.0 ml LV vol s, MOD A4C: 56.1 ml LV SV MOD A2C:     89.0 ml LV SV MOD A4C:     125.0 ml LV SV MOD BP:      78.6 ml RIGHT VENTRICLE             IVC RV S prime:     12.50 cm/s  IVC diam: 1.10 cm TAPSE (M-mode): 2.5 cm                             PULMONARY VEINS                             Diastolic Velocity: 39.60 cm/s                             S/D Velocity:       1.20                             Systolic Velocity:  48.40 cm/s LEFT ATRIUM             Index        RIGHT ATRIUM           Index LA diam:        3.80 cm 1.84 cm/m   RA  Area:     12.20 cm LA Vol (A2C):   53.1 ml 25.73 ml/m  RA Volume:   26.10 ml  12.64 ml/m LA Vol (A4C):   36.6 ml 17.73 ml/m LA Biplane  Vol: 45.8 ml 22.19 ml/m  AORTIC VALVE                     PULMONIC VALVE AV Area (Vmax):    3.58 cm      PV Vmax:       0.98 m/s AV Area (Vmean):   3.64 cm      PV Peak grad:  3.9 mmHg AV Area (VTI):     3.37 cm AV Vmax:           153.00 cm/s AV Vmean:          110.000 cm/s AV VTI:            0.270 m AV Peak Grad:      9.4 mmHg AV Mean Grad:      5.0 mmHg LVOT Vmax:         121.00 cm/s LVOT Vmean:        88.400 cm/s LVOT VTI:          0.201 m LVOT/AV VTI ratio: 0.74  AORTA Ao Root diam: 3.40 cm Ao Asc diam:  3.40 cm MITRAL VALVE MV Area (PHT): 4.01 cm    SHUNTS MV Area VTI:   4.61 cm    Systemic VTI:  0.20 m MV Peak grad:  2.8 mmHg    Systemic Diam: 2.40 cm MV Mean grad:  1.0 mmHg MV Vmax:       0.83 m/s MV Vmean:      57.2 cm/s MV Decel Time: 189 msec MV E velocity: 60.80 cm/s MV A velocity: 94.90 cm/s MV E/A ratio:  0.64 Darryle Decent MD Electronically signed by Darryle Decent MD Signature Date/Time: 07/18/2024/1:44:56 PM    Final    CT ANGIO HEAD NECK W WO CM Result Date: 07/18/2024 EXAM: CTA Head and Neck with Intravenous Contrast. CT Head without Contrast. CLINICAL HISTORY: Stroke/TIA, determine embolic source. Acute/subacute infarcts involving the left lentiform nucleus. TECHNIQUE: Axial CTA images of the head and neck performed with intravenous contrast. MIP reconstructed images were created and reviewed. Axial computed tomography images of the head/brain performed without intravenous contrast. Note: Per PQRS, the description of internal carotid artery percent stenosis, including 0 percent or normal exam, is based on North American Symptomatic Carotid Endarterectomy Trial (NASCET) criteria. Dose reduction technique was used including one or more of the following: automated exposure control, adjustment of mA and kV according to patient size, and/or iterative  reconstruction. CONTRAST: With; 75 mL (iohexol  (OMNIPAQUE ) 350 MG/ML injection 75 mL IOHEXOL  350 MG/ML SOLN). COMPARISON: None provided. FINDINGS: CT HEAD: BRAIN: Noncontrast head images demonstrate a subacute left basal ganglia infarct. No acute intraparenchymal hemorrhage. No mass lesion. No CT evidence for acute territorial infarct. No midline shift or extra-axial collection. VENTRICLES: No hydrocephalus. ORBITS: The orbits are unremarkable. SINUSES AND MASTOIDS: The paranasal sinuses and mastoid air cells are clear. CTA NECK: COMMON CAROTID ARTERIES: Common origin of the left common carotid artery and the innominate artery was noted, a normal variant. No significant stenosis. No dissection or occlusion. INTERNAL CAROTID ARTERIES: No stenosis by NASCET criteria. No dissection or occlusion. VERTEBRAL ARTERIES: The right vertebral artery is dominant. The left vertebral artery originates directly from the aortic arch, a normal variant. No significant stenosis. No dissection or occlusion. CTA HEAD: ANTERIOR CEREBRAL ARTERIES: No significant stenosis. No occlusion. No aneurysm. MIDDLE CEREBRAL ARTERIES: No significant stenosis. No occlusion. No aneurysm. POSTERIOR CEREBRAL ARTERIES: A right fetal type posterior cerebral artery is present. The left posterior cerebral  artery originates from the left P1 segment and the left posterior communicating artery. The left posterior communicating artery is occluded. PCA branch vessels are within normal limits bilaterally. BASILAR ARTERY: The basilar artery is small. No significant stenosis. No occlusion. No aneurysm. OTHER: SOFT TISSUES: No acute finding. No masses or lymphadenopathy. BONES: No acute osseous abnormality. IMPRESSION: 1. Acute/subacute infarcts involving the left lentiform nucleus. 2. 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  Electronically signed by: Lonni Necessary MD 07/18/2024 11:46 AM EST RP Workstation: [id number]   MR BRAIN/IAC W WO CONTRAST Result Date: 07/17/2024 EXAM: MR Internal Auditory Canals without and with Contrast 07/17/2024 03:11:00 PM TECHNIQUE: Multiplanar, multisequence MRI of the internal auditory canals was performed without and with administration of intravenous contrast. COMPARISON: MRI HEAD from 06/22/2016. CLINICAL HISTORY: Hearing loss ICD-10 Code: 357797 FINDINGS: INTERNAL AUDITORY CANALS: No mass or abnormal enhancement. Inner ear structures are unremarkable. MASTOIDS AND MIDDLE EARS: Clear. BRAIN AND BRAINSTEM: Two small foci of restricted diffusion in the left basal ganglia including an area with peripheral enhancement for example, c-series 3, images 21 and 28 and series 14, image 76. Mild edema without mass effect. Additional punctate focus of enhancement in the right basal ganglia (series 14 image 70) without associated restricted diffusion. No edema there. Remote right thalamic lacunar infarct. SINUSES: No acute abnormality. BONES AND SOFT TISSUES: Normal bone marrow signal. IMPRESSION: 1. Two small foci of restricted diffusion in the left basal ganglia including an area with peripheral enhancement, most likely subacute enhancing perforator infarcts. 2. Additional punctate focus of enhancement in the right basal ganglia without associated restricted diffusion. While nonspecific, given the above findings this most likely represents an additional enhancing subacute infarct. The distribution and appearance is atypical for demyelination or metastases. Atypical infection (such as cryptococcosis) is also less likely but should be considered if the patient is immunocompromised. 3. A short-interval follow up MRI head with contrast in 4-6 weeks is recommended to help ensure expected evolution of the above findings. 4. No retrocochlear mass. 5. Remote right thalamic lacunar infarct. Electronically signed  by: Glendia Molt MD 07/17/2024 03:32 PM EST RP Workstation: [id number]   CT Head Wo Contrast Result Date: 07/17/2024 EXAM: CT HEAD WITHOUT CONTRAST 07/17/2024 01:10:52 PM TECHNIQUE: CT of the head was performed without the administration of intravenous contrast. Automated exposure control, iterative reconstruction, and/or weight based adjustment of the mA/kV was utilized to reduce the radiation dose to as low as reasonably achievable. COMPARISON: None available. CLINICAL HISTORY: Neuro deficit, acute, stroke suspected; acute L hearing loss FINDINGS: BRAIN AND VENTRICLES: No acute hemorrhage. No evidence of acute infarct. No hydrocephalus. No extra-axial collection. No mass effect or midline shift. ORBITS: No acute abnormality. SINUSES: Right maxillary sinus mucosal thickening. SOFT TISSUES AND SKULL: No acute soft tissue abnormality. No skull fracture. IMPRESSION: 1. No acute intracranial abnormality. Electronically signed by: Ryan Chess MD 07/17/2024 01:16 PM EST RP Workstation: [id number]     The results of significant diagnostics from this hospitalization (including imaging, microbiology, ancillary and laboratory) are listed below for reference.     Microbiology: No results found for this or any previous visit (from the past 240 hours).   Labs: BNP (last 3 results) No results for input(s): BNP in the last 8760 hours. Basic Metabolic Panel: Recent Labs  Lab 07/17/24 1142 07/18/24 0016 07/18/24 0424 07/19/24 0421  NA 132*  --  135 138  K 4.9  --  4.1 3.9  CL 96*  --  101 102  CO2 25  --  25 27  GLUCOSE 378* 396* 280* 175*  BUN 17  --  16 17  CREATININE 1.45*  --  1.34* 1.55*  CALCIUM  9.3  --  8.9 8.6*  MG  --   --   --  1.9  PHOS  --   --   --  4.5   Liver Function Tests: Recent Labs  Lab 07/17/24 1142  AST 37  ALT 23  ALKPHOS 70  BILITOT 0.4  PROT 6.9  ALBUMIN 3.7   No results for input(s): LIPASE, AMYLASE in the last 168 hours. No results for input(s):  AMMONIA in the last 168 hours. CBC: Recent Labs  Lab 07/17/24 1142 07/18/24 0424 07/19/24 0421  WBC 6.8 3.8* 4.1  HGB 12.3* 12.4* 12.0*  HCT 34.6* 34.8* 33.4*  MCV 80.3 80.2 78.2*  PLT 274 255 250   Cardiac Enzymes: No results for input(s): CKTOTAL, CKMB, CKMBINDEX, TROPONINI in the last 168 hours. BNP: Invalid input(s): POCBNP CBG: Recent Labs  Lab 07/18/24 0812 07/18/24 1115 07/18/24 1608 07/18/24 2110 07/19/24 0624  GLUCAP 281* 204* 144* 209* 157*   D-Dimer No results for input(s): DDIMER in the last 72 hours. Hgb A1c Recent Labs    07/18/24 0424  HGBA1C 12.5*   Lipid Profile Recent Labs    07/18/24 0424  CHOL 242*  HDL 54  LDLCALC 160*  TRIG 140  CHOLHDL 4.5   Thyroid function studies No results for input(s): TSH, T4TOTAL, T3FREE, THYROIDAB in the last 72 hours.  Invalid input(s): FREET3 Anemia work up No results for input(s): VITAMINB12, FOLATE, FERRITIN, TIBC, IRON, RETICCTPCT in the last 72 hours. Urinalysis    Component Value Date/Time   COLORURINE YELLOW 07/17/2024 1350   APPEARANCEUR HAZY (A) 07/17/2024 1350   LABSPEC 1.021 07/17/2024 1350   PHURINE 5.0 07/17/2024 1350   GLUCOSEU >=500 (A) 07/17/2024 1350   HGBUR SMALL (A) 07/17/2024 1350   BILIRUBINUR NEGATIVE 07/17/2024 1350   KETONESUR NEGATIVE 07/17/2024 1350   PROTEINUR >=300 (A) 07/17/2024 1350   NITRITE NEGATIVE 07/17/2024 1350   LEUKOCYTESUR NEGATIVE 07/17/2024 1350   Sepsis Labs Recent Labs  Lab 07/17/24 1142 07/18/24 0424 07/19/24 0421  WBC 6.8 3.8* 4.1   Microbiology No results found for this or any previous visit (from the past 240 hours).   Time coordinating discharge:  I have spent 35 minutes face to face with the patient and on the ward discussing the patients care, assessment, plan and disposition with other care givers. >50% of the time was devoted counseling the patient about the risks and benefits of treatment/Discharge  disposition and coordinating care.   SIGNED:   Burgess JAYSON Dare, MD  [facility] 07/19/2024, 10:12 AM   If 7PM-7AM, please contact night-coverage      [1] No Known Allergies

## 2024-07-19 NOTE — Inpatient Diabetes Management (Signed)
 Inpatient Diabetes Program Recommendations  AACE/ADA: New Consensus Statement on Inpatient Glycemic Control (2015)  Target Ranges:  Prepandial:   less than 140 mg/dL      Peak postprandial:   less than 180 mg/dL (1-2 hours)      Critically ill patients:  140 - 180 mg/dL   Lab Results  Component Value Date   GLUCAP 157 (H) 07/19/2024   HGBA1C 12.5 (H) 07/18/2024    Review of Glycemic Control  Latest Reference Range & Units 07/18/24 21:10 07/19/24 06:24  Glucose-Capillary 70 - 99 mg/dL 790 (H) 842 (H)  (H): Data is abnormally high Diabetes history: Type 2 DM Outpatient Diabetes medications:  Lantus  18 units at bedtime (NT), Metformin  1000 mg BID Current orders for Inpatient glycemic control: Novolog  0-9 units TID & HS, Novolog  5 units TID, Lantus  18 units QHS  Inpatient Diabetes Program Recommendations:    Spoke with patient regarding outpatient diabetes management. Patient had not yet picked up prescriptions for Lantus . Has given injections in the past. Reports recent weight loss. Reviewed patient's current A1c of 12.5%. Explained what a A1c is and what it measures. Also reviewed goal A1c with patient, importance of good glucose control @ home, and blood sugar goals. Reviewed patho of DM, need for insulin , role of pancreas, differences between long acting vs short acting insulin , copay for Basaglar , Relion, hyper glycemia vs hypoglycemia, vascular changes, and commorbidities.  Patient recently bought a meter. Reviewed frequency for checking CBGs. Reviewed CGMs with patient. Plans to follow up with PCP regarding CGM.  Drinks lots of coffee and energy drinks. Reviewed alternatives, total CHOs, importance of protein and overall mindfulness of CHO. Placed referral for outpatient education and RPH.  Educated patient and spouse on insulin  pen use at home. Reviewed contents of insulin  flexpen starter kit. Reviewed all steps of insulin  pen including attachment of needle, 2-unit air shot, dialing  up dose, giving injection, removing needle, disposal of sharps, storage of unused insulin , disposal of insulin  etc. Patient able to provide successful return demonstration. Also reviewed troubleshooting with insulin  pen. MD to give patient Rxs for insulin  pens and insulin  pen needles.  Thanks, Tinnie Minus, MSN, RNC-OB Diabetes Coordinator [phone] (8a-5p)

## 2024-07-19 NOTE — Progress Notes (Signed)

## 2024-07-19 NOTE — TOC CM/SW Note (Signed)
 Transition of Care [facility] - Inpatient Brief Assessment   Patient Details  Name: Donald Carter MRN: [id number] Date of Birth: 23-Feb-1976  [facility]) CM/SW Contact:    Sudie Erminio Deems, RN Phone Number: 07/19/2024, 11:18 AM   Clinical Narrative: Patient presented for hearing loss and elevated blood pressure. PTA patient was from home. Patient was seeing Barnie Louder at the [facility]; however, patient stats he has not seen her in years. Patient is aware to call the clinic to schedule an appointment as soon as possible. Patient has [employer]- [employer] states they can bill the patient for medications. No further needs identified at this time.   Transition of Care Asessment: Insurance and Status: Insurance coverage has been reviewed environmental health practitioner) Patient has primary care physician: No (per patient has not been in years) Home environment has been reviewed: reviewed Prior level of function:: independent Prior/Current Home Services: No current home services Social Drivers of Health Review: SDOH reviewed no interventions necessary Readmission risk has been reviewed: Yes Transition of care needs: no transition of care needs at this time

## 2024-07-20 ENCOUNTER — Telehealth: Payer: Self-pay

## 2024-07-20 LAB — LUPUS ANTICOAGULANT PANEL
DRVVT: 39.1 s (ref 0.0–47.0)
PTT Lupus Anticoagulant: 31.9 s (ref 0.0–43.5)

## 2024-07-20 LAB — PROTEIN C ACTIVITY: Protein C Activity: 136 % (ref 73–180)

## 2024-07-20 LAB — CALCIUM, IONIZED: Calcium, Ionized, Serum: 4.8 mg/dL (ref 4.5–5.6)

## 2024-07-20 LAB — HOMOCYSTEINE: Homocysteine: 15.2 umol/L — ABNORMAL HIGH (ref 0.0–14.5)

## 2024-07-20 LAB — PROTEIN S, TOTAL: Protein S Ag, Total: 119 % (ref 60–150)

## 2024-07-20 LAB — PROTEIN S ACTIVITY: Protein S Activity: 80 % (ref 63–140)

## 2024-07-20 NOTE — Transitions of Care (Post Inpatient/ED Visit) (Signed)
" ° °  07/20/2024  Name: Donald Carter MRN: [id number] DOB: 1975-08-09  Today's TOC FU Call Status: Today's TOC FU Call Status:: Unsuccessful Call (1st Attempt) Unsuccessful Call (1st Attempt) Date: 07/20/24  Attempted to reach the patient regarding the most recent Inpatient/ED visit.  Follow Up Plan: Additional outreach attempts will be made to reach the patient to complete the Transitions of Care (Post Inpatient/ED visit) call.   Signature  Slater Diesel, RN   "

## 2024-07-21 ENCOUNTER — Telehealth: Payer: Self-pay

## 2024-07-21 NOTE — Transitions of Care (Post Inpatient/ED Visit) (Signed)
" ° °  07/21/2024  Name: Donald Carter MRN: [id number] DOB: 02-01-1976  Today's TOC FU Call Status: Today's TOC FU Call Status:: Unsuccessful Call (2nd Attempt) Unsuccessful Call (1st Attempt) Date: 07/20/24 Unsuccessful Call (2nd Attempt) Date: 07/21/24  Attempted to reach the patient regarding the most recent Inpatient/ED visit.  Follow Up Plan: Additional outreach attempts will be made to reach the patient to complete the Transitions of Care (Post Inpatient/ED visit) call.   Signature  Slater Diesel, RN   "

## 2024-07-22 ENCOUNTER — Telehealth: Payer: Self-pay

## 2024-07-22 LAB — CARDIOLIPIN ANTIBODIES, IGG, IGM, IGA
Anticardiolipin IgA: <9 U/mL (ref 0–11)
Anticardiolipin IgG: <9 GPL U/mL (ref 0–14)
Anticardiolipin IgM: <9 [MPL'U]/mL (ref 0–12)

## 2024-07-22 LAB — PROTHROMBIN GENE MUTATION

## 2024-07-22 NOTE — Transitions of Care (Post Inpatient/ED Visit) (Signed)
" ° °  07/22/2024  Name: Donald Carter MRN: [id number] DOB: 17-Jan-1976  Today's TOC FU Call Status: Today's TOC FU Call Status:: Unsuccessful Call (3rd Attempt) Unsuccessful Call (1st Attempt) Date: 07/20/24 Unsuccessful Call (2nd Attempt) Date: 07/21/24 Unsuccessful Call (3rd Attempt) Date: 07/22/24  Attempted to reach the patient regarding the most recent Inpatient/ED visit.  Follow Up Plan: No further outreach attempts will be made at this time. We have been unable to contact the patient.  Letter sent to patient requesting he call this clinic to schedule a follow up appointment and we can schedule him at [facility].   Dr Vicci was listed as his PCP but he has not been seen since 1/202.    Signature Slater Diesel, RN   "

## 2024-07-23 ENCOUNTER — Encounter (INDEPENDENT_AMBULATORY_CARE_PROVIDER_SITE_OTHER): Payer: Self-pay

## 2024-07-23 ENCOUNTER — Ambulatory Visit (INDEPENDENT_AMBULATORY_CARE_PROVIDER_SITE_OTHER): Payer: Self-pay

## 2024-07-23 VITALS — BP 137/87 | HR 87

## 2024-07-23 DIAGNOSIS — H9192 Unspecified hearing loss, left ear: Secondary | ICD-10-CM

## 2024-07-23 DIAGNOSIS — H9042 Sensorineural hearing loss, unilateral, left ear, with unrestricted hearing on the contralateral side: Secondary | ICD-10-CM

## 2024-07-23 DIAGNOSIS — H6121 Impacted cerumen, right ear: Secondary | ICD-10-CM

## 2024-07-23 LAB — FACTOR 5 LEIDEN

## 2024-07-23 LAB — BETA-2-GLYCOPROTEIN I ABS, IGG/M/A
Beta-2 Glyco I IgG: <9 GPI IgG units (ref 0–20)
Beta-2-Glycoprotein I IgA: <9 GPI IgA units (ref 0–25)
Beta-2-Glycoprotein I IgM: <9 GPI IgM units (ref 0–32)

## 2024-07-23 NOTE — Progress Notes (Signed)
 Dear Dr. Vicci, Here is my assessment for our mutual patient, Donald Carter. Thank you for allowing me the opportunity to care for your patient. Please do not hesitate to contact me should you have any other questions. Sincerely, Dr. Hadassah Parody  Otolaryngology Clinic Note Referring provider: Dr. Vicci HPI:   Initial HPI (07/23/2024)  49 year old male who presents with concern for sudden onset left sided sensorineural hearing loss.  Last Thursday he had abrupt complete hearing loss in left ear upon awakening.  Ever since then he has had persistent tinnitus and a sensation of fullness in the left ear.  He went to the emergency department at the time, there was concern for stroke and he was admitted.  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  It was not thought that these findings would be related to hearing loss.  Steroids were not administered at that time given his glucose is running in the 200s to 400s, hemoglobin A1c is 12.5  Patient reports tuning fork was performed in the hospital and he was able to hear the tuning fork on the left side to some degree.  Neurology note discusses that he was able to hear the tuning fork on the left side through both air and bone-conduction.   He has no symptoms on the right side.   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, possibly in the right basal ganglia     PMH/Meds/All/SocHx/FamHx/ROS:   Past Medical History:  Diagnosis Date   Diabetes mellitus without complication (HCC)    Hypertension      Past Surgical History:  Procedure Laterality Date   FINGER EXPLORATION     Rt index finger   INCISION AND DRAINAGE Right 02/21/2017   Procedure: INCISION AND DRAINAGE WITH EXCISION PYOGENIC GRANULOMA RIGHT INDEX FINGER;  Surgeon: Murrell Drivers, MD;  Location:  SURGERY CENTER;  Service: Orthopedics;  Laterality: Right;  Bier block with MAC    Family History  Problem Relation Age of Onset   Diabetes Mother    Diabetes Father      Social Connections: Not on file     Current Outpatient Medications  Medication Instructions   aspirin  EC 81 mg, Oral, Daily, Swallow whole.   atorvastatin  (LIPITOR) 40 mg, Oral, Daily   Blood Glucose Monitoring Suppl (BLOOD GLUCOSE MONITOR SYSTEM) w/Device KIT Dispense based on patient and insurance preference. Use up to four times daily as directed. (FOR ICD-10 E10.9, E11.9).   clopidogrel  (PLAVIX ) 75 mg, Oral, Daily   Glucose Blood (BLOOD GLUCOSE TEST STRIPS) STRP Dispense based on patient and insurance preference. Use up to four times daily as directed. (FOR ICD-10 E10.9, E11.9).   insulin  glargine-yfgn (SEMGLEE ) 18 Units, Subcutaneous, Daily, May substitute as needed per insurance.   Insulin  Pen Needle 32G X 4 MM MISC Inject 1 with insulin  pen into the skin 4 (four) times daily.   Lancet Device MISC 1 each, Does not apply, As directed, Dispense based on patient and insurance preference. Use up to four times daily as directed. (FOR ICD-10 E10.9, E11.9).   Lancets (ONETOUCH DELICA PLUS LANCET33G) MISC Dispense based on patient and insurance preference. Use up to four times daily as directed. (FOR ICD-10 E10.9, E11.9).   NovoLOG  FlexPen 5 Units, Subcutaneous, 3 times daily with meals, If eating and Blood Glucose (BG) 80 or higher inject  5 units for meal coverage and add correction dose per scale. If not eating, correction dose only. BG <150= 0 unit; BG 150-200= 1 unit; BG 201-250= 3 unit; BG 251-300= 5 unit; BG 301-350= 7 unit; BG 351-400= 9 unit; BG >400= 11 unit and Call Primary Care.     Physical Exam:   BP 137/87 (BP Location: Left Arm, Patient Position: Sitting)   Pulse 87   SpO2 98%   Salient findings:  CN II-XII intact with the exception of CNVIII as above  Given history and  complaints, ear microscopy was indicated and performed for evaluation with findings as below in physical exam section and in procedures  Left EAC clear and TM intact with well pneumatized middle ear spaces Right EAC with cerumen impaction, following removal the TM was visualized and noted to be intact with well-pneumatized middle ear space Weber 512: To the right Right Rinne 512: AC > BC  Left Rinne 512: no response   No lesions of oral cavity/oropharynx  No obviously palpable neck masses/lymphadenopathy/thyromegaly  No respiratory distress or stridor  Seprately Identifiable Procedures:  Prior to initiating any procedures, risks/benefits/alternatives were explained to the patient and verbal consent obtained.  Procedure (07/23/2024): Bilateral ear microscopy and cerumen removal using microscope (CPT [phone]) - Mod 25- right Pre-procedure diagnosis: right Cerumen impaction  Post-procedure diagnosis: same Indication: Right cerumen impaction; given patient's otologic complaints and history as well as for improved and comprehensive examination of external ear and tympanic membrane, bilateral otologic examination using microscope was performed and impacted cerumen removed  Procedure: Patient was placed semi-recumbent. Both ear canals were examined using the microscope with findings above. Cerumen removed on right using suction and currette with improvement in EAC examination and patency. Findings noted above. Patient tolerated the procedure well.   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 with concern for left sudden sensorineural hearing loss and asymmetrical sensorineural hearing loss. We discussed possible transtympanic steroids, which patient wished to  do. Risks, benefits, and alternatives of the procedure were discussed with the patient, including the risks of vertigo and tympanic membrane perforation. Patient agreed to proceed and signed consent.  Findings: left aerated middle ear, successful dexamethasone  injection  Complications: None apparent  Procedure details: Patient was placed semi recumbent on the exam chair. Using the binocular operating microscope,  topical phenol was applied to the TM. Next, a transtympanic injection was performed through the anesthetized tympanic membrane to instill dexamethasone  into the middle ear. Tragal pump was performed. The patient was placed with the head turned to the opposite side of the injection for 15 minutes.   Patient tolerated the procedure well   Impression & Plans:  Donald Carter is a 49 y.o. male with     ICD-10-CM   1. Hearing loss of left ear, unspecified hearing loss type  H91.92     2. Impacted cerumen of right ear  H61.21      Assessment and Plan Assessment & Plan Suspected sudden sensorineural hearing loss, left ear Acute onset of left-sided sudden sensorineural hearing loss with associated tinnitus and aural pressure. No prior episodes or dizziness.  He was hospitalized at the time of the hearing loss as there was some concern for stroke.  Neurology saw the patient and performed tuning fork testing and at that time their report discusses patient was able  to hear the tuning fork on the left side through air and bone-conduction.  On my exam today, he is not able to hear the tuning fork at all on the left side.  We do not yet have an audiogram on him and will need to get this is soon as possible.  Unable to do oral steroids given his hemoglobin A1c is 12.5 and his sugars were recently up in the 400s.  Discussed doing intratympanic steroid injection today in the case that this is sudden sensorineural loss.  The risks for this would include possible perforation and vertigo.  He wanted to  proceed and so we performed this today.  I want to get an audiogram on him as soon as we are able to schedule this.  If audiogram confirms sudden sensorineural loss will need repeat injection next week.   - Performed left tympanic membrane anesthesia and intratympanic steroid injection in clinic. - Discussed rationale for early steroid therapy to promote neural recovery and potential hearing improvement. - Arranged urgent audiogram within one week. - I will be out of town next week.  Planned follow-up with another provider for possible repeat intratympanic steroid injections depending on audiogram results.  Impacted cerumen, right ear Cerumen impaction of the right ear. - Performed right ear cerumen removal in clinic.    See below regarding exact medications prescribed this encounter including dosages and route: No orders of the defined types were placed in this encounter.     Thank you for allowing me the opportunity to care for your patient. Please do not hesitate to contact me should you have any other questions.  Sincerely, Hadassah Parody, MD Otolaryngologist (ENT), [facility] Phone: [phone] Fax: [phone]  MDM:  Level 4 Complexity/Problems addressed: 4-acute problem uncertain prognosis Data complexity: 4-  independent review of labs, imaging, ordered audiogram - Morbidity: -   - Prescription Drug prescribed or managed:

## 2024-07-28 ENCOUNTER — Ambulatory Visit (INDEPENDENT_AMBULATORY_CARE_PROVIDER_SITE_OTHER): Admitting: Audiology

## 2024-07-28 ENCOUNTER — Ambulatory Visit (INDEPENDENT_AMBULATORY_CARE_PROVIDER_SITE_OTHER): Admitting: Physician Assistant

## 2024-07-29 ENCOUNTER — Ambulatory Visit (INDEPENDENT_AMBULATORY_CARE_PROVIDER_SITE_OTHER): Payer: Self-pay | Admitting: Audiology

## 2024-07-29 ENCOUNTER — Telehealth (INDEPENDENT_AMBULATORY_CARE_PROVIDER_SITE_OTHER): Payer: Self-pay

## 2024-07-29 ENCOUNTER — Telehealth (INDEPENDENT_AMBULATORY_CARE_PROVIDER_SITE_OTHER): Payer: Self-pay | Admitting: Physician Assistant

## 2024-07-29 DIAGNOSIS — H9042 Sensorineural hearing loss, unilateral, left ear, with unrestricted hearing on the contralateral side: Secondary | ICD-10-CM

## 2024-07-29 DIAGNOSIS — H9072 Mixed conductive and sensorineural hearing loss, unilateral, left ear, with unrestricted hearing on the contralateral side: Secondary | ICD-10-CM

## 2024-07-29 NOTE — Telephone Encounter (Signed)
 Called patient to let them know the appointment they had with PA Hedges tomorrow 07/30/2024 in the afternoon we needed to change because PA Hedges would need Dr. Roark to do a steroid injection. Patient stated they could not come in sooner in the day when Dr.Byers would be here because the bus does not operate that early. We looked at Dr. Vergie schedule he was booked up so we looked at [facility] Tuesday 08/04/2024 schedule patient stated they could not come in until 4:00 PM that day. I did let the patient know that he needs the steroid shot patient understood but still wanted to keep the Tuesday 08/04/2024 at 4:00 PM schedule.

## 2024-07-29 NOTE — Progress Notes (Signed)
"                                                                                                                                                                                                                                                                                                                                                                                                                                                                                                                                                                                                                                                                                                                                                                                                                                                                                                                                                                                                                                                                                                                                                                                                                                                                                                                                                                                                                                                                                                                                                                                                                                                                                                                                                                                                                                                                                                                                                                                                                                                                                                                                                                                                                                                                                                                                                                                                                                                                                                                                                                                                                                                                                                                                                                                                                                                                                                                                                                                                                                                                                                                                                                                                                                              °  [address] [place], [place] [address] [phone]  Audiological Evaluation    Name: Donald Carter     DOB:   1975/08/27      MRN:   [id number]                                                                                     Service Date: 07/29/2024     Accompanied by: self   Patient comes today after Dr. Greggory, ENT sent a referral for a hearing evaluation due to concerns with sudden hearing loss. Patient is scheduled to see Hedges, PA-C tomorrow.    Symptoms Yes Details  Hearing loss  [x]  07/16/2024 hearing loss in left ear upon awakening. 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   High sugar levels    Otoscopy: Right ear: clear external ear canal and notable landmarks visualized on the tympanic membrane. Left ear:  clear external ear canal and notable landmarks visualized on the tympanic membrane.  Tympanometry: Right ear: Type A - Normal external ear canal volume with normal middle ear pressure and normal tympanic membrane compliance. Findings are consistent with normal middle ear function. Left ear: Type B - Normal external ear canal volume with no middle ear pressure peak or tympanic membrane compliance.  Findings are consistent with abnormal middle ear function.   Hearing Evaluation The hearing test results were completed under headphones and re-checked with inserts and results are deemed to be of good to fair reliability. Test technique:  conventional    Pure tone Audiometry: Right ear- Normal hearing from [phone] Hz.  Left ear-  Moderately-severe mixed hearing loss from 250 Hz - 8000 Hz.  Speech Audiometry: Right ear- Speech Reception Threshold (SRT) was obtained at 10 dBHL. Left ear-Speech Reception Threshold (SRT) was obtained at 60 dBHL.with contralateral masking    Word Recognition Score Tested using NU-6 (recorded) Right ear: 100% was obtained at a presentation level of 50 dBHL with contralateral masking which is deemed as  excellent. Left ear: 4% was obtained at a presentation level of 90 dBHL with contralateral masking which is deemed as  poor.   Impression: There is a significant difference in pure-tone thresholds between ears. There is a significant difference in word recognition scores , worse in the left ear.     Recommendations: Follow up with ENT as scheduled. Repeat audiogram after medical care.   Labrea Eccleston MARIE  LEROUX-MARTINEZ, AUD  "

## 2024-07-29 NOTE — Telephone Encounter (Signed)
 Donald Carter had audiological evaluation today.  He was seen by Dr. Greggory last week for sudden hearing loss.  He does appear that he has left-sided sensorineural hearing loss on audiological evaluation with a left-sided type B tympanometry.  Question if the type B is secondary to intratympanic steroid injection last week.  Dr. Greggory recommended repeat intratympanic steroid injection this week, the patient missed his appointment yesterday due to transportation issues.  I reached out to him today, he is unable to be seen in the office this week when a provider is available to perform the injection.  He understands that this is the treatment plan but he has no form of transportation other than the bus.  He is not a candidate for oral steroids as his A1c is 12.5 with significant elevation in his CBG.  I stressed the importance of close follow-up with our office for regular injections, he assures he will follow-up next week for the next injection as this is the next time he is able to get to our office.

## 2024-07-30 ENCOUNTER — Telehealth (INDEPENDENT_AMBULATORY_CARE_PROVIDER_SITE_OTHER): Payer: Self-pay | Admitting: Physician Assistant

## 2024-07-30 ENCOUNTER — Ambulatory Visit (INDEPENDENT_AMBULATORY_CARE_PROVIDER_SITE_OTHER): Admitting: Physician Assistant

## 2024-07-30 NOTE — Telephone Encounter (Signed)
 Attempted to call patient to discuss management. I would like to speak with him about oral steroids for the sudden hearing loss. I left a voicemail for him to call back.

## 2024-07-31 ENCOUNTER — Telehealth (INDEPENDENT_AMBULATORY_CARE_PROVIDER_SITE_OTHER): Payer: Self-pay

## 2024-07-31 ENCOUNTER — Telehealth (INDEPENDENT_AMBULATORY_CARE_PROVIDER_SITE_OTHER): Payer: Self-pay | Admitting: Physician Assistant

## 2024-07-31 LAB — PROTEIN C, TOTAL: Protein C, Total: 102 % (ref 60–150)

## 2024-07-31 NOTE — Telephone Encounter (Signed)
 Spoke to [employer] at [facility] and [facility] regarding patient's urgent need to be seen today. 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 I explained that the patient does not have a PCP.  Dominique checked every [facility] PCP office schedule in [place]. He explained that nobody has an opening today but he sent a message to [facility] at the [facility] to see if the patient can possibly be worked in today because of the situation and his recent hospitalization. I called [facility] at the [facility] [phone]) I spoke to Kevelyn and explained the situation to her. She said that they would not be able to work him in. She looked at every Primary Care schedule and explained that there were no openings with them and that the earliest opening at any [facility] would be on 08/06/2024. I explained to them that would be too late and that I appreciated their help with this situation.

## 2024-07-31 NOTE — Telephone Encounter (Signed)
 Patient called stating they were returning PA Hedges phone call. Patient asked for a call back.

## 2024-07-31 NOTE — Telephone Encounter (Signed)
 I spoke with Donald Carter today as he returned the phone call.  As recommended by Dr. Roark he would recommend this patient going on oral steroids for the sudden hearing loss.  The patient is a uncontrolled diabetic and will need close management with a primary care physician if oral steroids are initiated.  As documented on his discharge summary they did discuss oral steroids but due to his uncontrolled diabetes and risk of DKA they opted against this.  I have requested our office staff to reach out to [facility] as the patient was supposed to be seen by them as an outpatient to see if they would be able to see him today initiate oral steroids and close monitoring of his blood sugar to assure no complications of this.  The patient is just over 2 weeks since initial onset so he still has some potential benefit of initiating oral steroids although the likelihood of improvement beyond 2 weeks starts to go down.  I also discussed his other option of going to the hospital for inpatient management of his blood sugar while initiating oral steroids, the patient notes that he cannot do this, he cannot afford this and would have to not work.  At this point our only option is to see if [facility] can see the patient initiate oral steroids and manage the blood sugar if not no other options other than intratympanic steroids are available.  The patient was supposed to be seen this week for intratympanic injection but due to transportation issues was unable to make it at a time the physician was available to do this.  The next available time that a physician can do this and that the patient is available via transportation is next week.

## 2024-08-04 ENCOUNTER — Ambulatory Visit (INDEPENDENT_AMBULATORY_CARE_PROVIDER_SITE_OTHER): Admitting: Otolaryngology

## 2024-08-04 ENCOUNTER — Telehealth (INDEPENDENT_AMBULATORY_CARE_PROVIDER_SITE_OTHER): Payer: Self-pay

## 2024-08-04 NOTE — Telephone Encounter (Signed)
 I called the patient with his self pay estimate for the injection in his ear, he stated he could not afford that visit.  I advised that he call his insurance and ask them for an ENT in the area that is in his network as we are not.  The patient voiced understanding on the estimate total for the visit. I can canceled his appointment at this time.  The estimate is attached to his chart.

## 2024-08-05 ENCOUNTER — Ambulatory Visit (INDEPENDENT_AMBULATORY_CARE_PROVIDER_SITE_OTHER)

## 2024-08-05 ENCOUNTER — Encounter (INDEPENDENT_AMBULATORY_CARE_PROVIDER_SITE_OTHER): Payer: Self-pay

## 2024-08-05 ENCOUNTER — Encounter: Admitting: Skilled Nursing Facility1

## 2024-08-05 ENCOUNTER — Other Ambulatory Visit (INDEPENDENT_AMBULATORY_CARE_PROVIDER_SITE_OTHER): Payer: Self-pay

## 2024-08-05 DIAGNOSIS — H9042 Sensorineural hearing loss, unilateral, left ear, with unrestricted hearing on the contralateral side: Secondary | ICD-10-CM

## 2024-08-05 NOTE — Telephone Encounter (Signed)
 Patient called and left a voicemail message at 2:14pm today requesting a call back regarding sending a referral to a provider that will take his insurance.  I called and spoke with the patient.  He stated that Dr. Ida Loader at [address], [address], [place], [place] [address], Phone # [phone] is in network with his insurance.  Patient requested Dr. Greggory send the referral to this provider.

## 2024-08-07 ENCOUNTER — Telehealth: Payer: Self-pay | Admitting: Primary Care

## 2024-08-07 NOTE — Telephone Encounter (Signed)
 Called pt to schedule hospital follow up. Pt did not answer please advise.

## 2024-09-02 ENCOUNTER — Ambulatory Visit: Payer: Self-pay | Admitting: Family

## 2024-09-15 ENCOUNTER — Ambulatory Visit: Payer: Self-pay | Admitting: Student

## 2024-10-27 ENCOUNTER — Inpatient Hospital Stay: Admitting: Neurology
